# Patient Record
Sex: Female | Born: 1970
Health system: Southern US, Community
[De-identification: ages and names within clinical notes are randomized; demographics above are authoritative.]

## PROBLEM LIST (undated history)

## (undated) DIAGNOSIS — K219 Gastro-esophageal reflux disease without esophagitis: Secondary | ICD-10-CM

## (undated) HISTORY — PX: TUBAL LIGATION: SHX77

## (undated) HISTORY — DX: Gastro-esophageal reflux disease without esophagitis: K21.9

---

## 2016-02-05 ENCOUNTER — Encounter (HOSPITAL_COMMUNITY): Payer: Self-pay | Admitting: Family Medicine

## 2016-02-05 ENCOUNTER — Ambulatory Visit (HOSPITAL_COMMUNITY)
Admission: EM | Admit: 2016-02-05 | Discharge: 2016-02-05 | Disposition: A | Discharge status: Home / Self Care | Payer: Self-pay | Attending: Family Medicine | Admitting: Family Medicine

## 2016-02-05 DIAGNOSIS — R110 Nausea: Secondary | ICD-10-CM

## 2016-02-05 DIAGNOSIS — R1011 Right upper quadrant pain: Secondary | ICD-10-CM

## 2016-02-05 MED ORDER — ONDANSETRON 8 MG PO TBDP: 8.0000 mg | ORAL_TABLET | Freq: Three times a day (TID) | ORAL | 0 refills | Status: DC | PRN

## 2016-02-05 MED ORDER — HYDROCODONE-ACETAMINOPHEN 5-325 MG PO TABS: 2.0000 | ORAL_TABLET | ORAL | 0 refills | Status: DC | PRN

## 2016-02-05 NOTE — Discharge Instructions (Signed)
If developing severe abdominal pain and pain meds not helping or having fever go to ED. Need to get PCP and get worked for gallbladder disease/ gallstones.

## 2016-02-05 NOTE — ED Provider Notes (Signed)
CSN: [id number]     Arrival date & time 02/05/16  1214 History   None    Chief Complaint  Patient presents with  . Abdominal Pain   (Consider location/radiation/quality/duration/timing/severity/associated sxs/prior Treatment) Patient states she was diagnosed with gallstones in [place].  She states she is getting worsening RUQ abdominal pain and nausea on and off.  Yesterday the pain was severe.  She is nauseated in the AM.  She has minimal pain today.   The history is provided by the patient.  Abdominal Pain  Pain location:  RUQ Pain quality: aching   Pain radiates to:  RUQ Pain severity:  Moderate Onset quality:  Sudden Duration:  2 days Timing:  Intermittent Progression:  Waxing and waning Relieved by:  Nothing Worsened by:  Eating Associated symptoms: nausea     History reviewed. No pertinent past medical history. History reviewed. No pertinent surgical history. History reviewed. No pertinent family history. Social History  Substance Use Topics  . Smoking status: Never Smoker  . Smokeless tobacco: Never Used  . Alcohol use Not on file   OB History    No data available     Review of Systems  Constitutional: Negative.   HENT: Negative.   Eyes: Negative.   Respiratory: Negative.   Cardiovascular: Negative.   Gastrointestinal: Positive for abdominal pain and nausea.  Endocrine: Negative.   Genitourinary: Negative.   Musculoskeletal: Negative.   Skin: Negative.   Allergic/Immunologic: Negative.   Neurological: Negative.   Hematological: Negative.   Psychiatric/Behavioral: Negative.     Allergies  Review of patient's allergies indicates no known allergies.  Home Medications   Prior to Admission medications   Medication Sig Start Date End Date Taking? Authorizing Provider  HYDROcodone-acetaminophen (NORCO/VICODIN) 5-325 MG tablet Take 2 tablets by mouth every 4 (four) hours as needed. 02/05/16   Lysbeth Penner, FNP  ondansetron (ZOFRAN ODT) 8 MG  disintegrating tablet Take 1 tablet (8 mg total) by mouth every 8 (eight) hours as needed for nausea or vomiting. 02/05/16   Lysbeth Penner, FNP   Meds Ordered and Administered this Visit  Medications - No data to display  BP 136/74   Pulse (!) 58   Temp 98.4 F (36.9 C)   Resp 18   LMP 01/29/2016   SpO2 100%  No data found.   Physical Exam  Constitutional: She is oriented to person, place, and time. She appears well-developed and well-nourished.  HENT:  Head: Normocephalic and atraumatic.  Eyes: Conjunctivae and EOM are normal. Pupils are equal, round, and reactive to light.  Neck: Normal range of motion. Neck supple.  Cardiovascular: Normal rate, regular rhythm and normal heart sounds.   Pulmonary/Chest: Effort normal and breath sounds normal.  Abdominal: Soft. Bowel sounds are normal. There is tenderness.  RUQ abdominal tenderness.  No peritoneal signs  Neurological: She is alert and oriented to person, place, and time.    Urgent Care Course   Clinical Course    Procedures (including critical care time)  Labs Review Labs Reviewed - No data to display  Imaging Review No results found.   Visual Acuity Review  Right Eye Distance:   Left Eye Distance:   Bilateral Distance:    Right Eye Near:   Left Eye Near:    Bilateral Near:         MDM   1. Right upper quadrant abdominal pain   2. Nausea    Norco 5/325 one po q 6 hours prn #12 Zofran 8mg   one po tid prn #21  Call and get PCP and get work up for gallbladder stones. If pain severe or fever go to ED.    Lysbeth Penner, FNP 02/05/16 1358

## 2016-02-05 NOTE — ED Triage Notes (Signed)
Pt here for RUQ pain that has been going on for months but worsening over the past week. sts that she has nausea ain the morning and the pain is worse after eating. sts that she has been told she had gallstones before.

## 2016-05-28 ENCOUNTER — Other Ambulatory Visit (HOSPITAL_COMMUNITY): Payer: Self-pay

## 2016-05-28 ENCOUNTER — Emergency Department (HOSPITAL_COMMUNITY)
Admission: EM | Admit: 2016-05-28 | Discharge: 2016-05-28 | Disposition: A | Discharge status: Home / Self Care | Payer: Self-pay | Attending: Emergency Medicine | Admitting: Emergency Medicine

## 2016-05-28 ENCOUNTER — Emergency Department (HOSPITAL_COMMUNITY): Payer: Self-pay

## 2016-05-28 ENCOUNTER — Encounter (HOSPITAL_COMMUNITY): Payer: Self-pay | Admitting: Emergency Medicine

## 2016-05-28 DIAGNOSIS — K8020 Calculus of gallbladder without cholecystitis without obstruction: Secondary | ICD-10-CM | POA: Insufficient documentation

## 2016-05-28 DIAGNOSIS — K8080 Other cholelithiasis without obstruction: Secondary | ICD-10-CM

## 2016-05-28 DIAGNOSIS — R1011 Right upper quadrant pain: Secondary | ICD-10-CM

## 2016-05-28 LAB — CBC
HCT: 38.8 % (ref 36.0–46.0)
Hemoglobin: 13 g/dL (ref 12.0–15.0)
MCH: 30.7 pg (ref 26.0–34.0)
MCHC: 33.5 g/dL (ref 30.0–36.0)
MCV: 91.7 fL (ref 78.0–100.0)
PLATELETS: 367 10*3/uL (ref 150–400)
RBC: 4.23 MIL/uL (ref 3.87–5.11)
RDW: 13 % (ref 11.5–15.5)
WBC: 3.8 10*3/uL — AB (ref 4.0–10.5)

## 2016-05-28 LAB — URINALYSIS, ROUTINE W REFLEX MICROSCOPIC
BILIRUBIN URINE: NEGATIVE
GLUCOSE, UA: NEGATIVE mg/dL
KETONES UR: NEGATIVE mg/dL
Nitrite: NEGATIVE
PH: 5.5 (ref 5.0–8.0)
PROTEIN: NEGATIVE mg/dL
Specific Gravity, Urine: 1.01 (ref 1.005–1.030)

## 2016-05-28 LAB — LIPASE, BLOOD: LIPASE: 39 U/L (ref 11–51)

## 2016-05-28 LAB — COMPREHENSIVE METABOLIC PANEL
ALBUMIN: 3.9 g/dL (ref 3.5–5.0)
ALT: 22 U/L (ref 14–54)
AST: 20 U/L (ref 15–41)
Alkaline Phosphatase: 29 U/L — ABNORMAL LOW (ref 38–126)
Anion gap: 6 (ref 5–15)
BUN: 9 mg/dL (ref 6–20)
CHLORIDE: 106 mmol/L (ref 101–111)
CO2: 26 mmol/L (ref 22–32)
CREATININE: 0.87 mg/dL (ref 0.44–1.00)
Calcium: 9.5 mg/dL (ref 8.9–10.3)
GFR calc Af Amer: >60 mL/min (ref >60)
GLUCOSE: 92 mg/dL (ref 65–99)
Potassium: 3.8 mmol/L (ref 3.5–5.1)
Sodium: 138 mmol/L (ref 135–145)
Total Bilirubin: 1 mg/dL (ref 0.3–1.2)
Total Protein: 7.2 g/dL (ref 6.5–8.1)

## 2016-05-28 LAB — I-STAT BETA HCG BLOOD, ED (MC, WL, AP ONLY): I-stat hCG, quantitative: <5 m[IU]/mL (ref <5)

## 2016-05-28 LAB — URINE MICROSCOPIC-ADD ON

## 2016-05-28 MED ORDER — FENTANYL CITRATE (PF) 100 MCG/2ML IJ SOLN
25.0000 ug | Freq: Once | INTRAMUSCULAR | Status: DC
  Filled 2016-05-28: qty 2

## 2016-05-28 MED ORDER — HYDROCODONE-ACETAMINOPHEN 5-325 MG PO TABS
1.0000 | ORAL_TABLET | Freq: Once | ORAL | Status: AC
  Administered 2016-05-28: 1 via ORAL
  Filled 2016-05-28: qty 1

## 2016-05-28 NOTE — ED Notes (Signed)
Pt states she wakes in the morning with nausea x 3 mornings.  Pain RUQ off and on approx one yr.  Was worked up and dx with gallstones in her country [place] prior to coming here.  Feels the pain is the same now.

## 2016-05-28 NOTE — ED Provider Notes (Signed)
[facility] DEPT Provider Note   CSN: [id number] Arrival date & time: 05/28/16  [id number]  History   Chief Complaint Chief Complaint  Patient presents with  . Abdominal Pain   HPI Haley Chandler is a 45 y.o. female.   Abdominal Pain   This is a recurrent problem. The current episode started more than 2 days ago (3 days). The problem occurs daily. The problem has not changed since onset.The pain is located in the RUQ. The pain is at a severity of 5/10. The pain is moderate. Associated symptoms include anorexia and nausea. Pertinent negatives include fever, diarrhea, flatus, vomiting, dysuria and hematuria. The symptoms are aggravated by eating.   History reviewed. No pertinent past medical history.  There are no active problems to display for this patient.  History reviewed. No pertinent surgical history.  OB History    No data available     Home Medications    Prior to Admission medications   Medication Sig Start Date End Date Taking? Authorizing Provider  HYDROcodone-acetaminophen (NORCO/VICODIN) 5-325 MG tablet Take 2 tablets by mouth every 4 (four) hours as needed. 02/05/16   Lysbeth Penner, FNP  ondansetron (ZOFRAN ODT) 8 MG disintegrating tablet Take 1 tablet (8 mg total) by mouth every 8 (eight) hours as needed for nausea or vomiting. 02/05/16   Lysbeth Penner, FNP    Family History History reviewed. No pertinent family history.  Social History Social History  Substance Use Topics  . Smoking status: Never Smoker  . Smokeless tobacco: Never Used  . Alcohol use Yes     Comment: occ     Allergies   Patient has no known allergies.   Review of Systems Review of Systems  Constitutional: Negative for fever.  Respiratory: Negative for chest tightness and shortness of breath.   Gastrointestinal: Positive for abdominal pain, anorexia and nausea. Negative for diarrhea, flatus and vomiting.  Genitourinary: Negative for dysuria and hematuria.  All other systems  reviewed and are negative.  Physical Exam Updated Vital Signs BP 129/79 (BP Location: Right Arm)   Pulse 72   Temp 98.2 F (36.8 C) (Oral)   Resp 22   SpO2 100%   Physical Exam  Constitutional: She is oriented to person, place, and time. She appears well-developed and well-nourished. No distress.  HENT:  Head: Normocephalic and atraumatic.  Eyes: Pupils are equal, round, and reactive to light.  Neck: Normal range of motion.  Cardiovascular: Normal rate and regular rhythm.   Pulmonary/Chest: Effort normal and breath sounds normal. No respiratory distress. She has no wheezes. She has no rales.  Abdominal: Soft. She exhibits no distension and no mass. There is tenderness. There is no rebound and no guarding.  RUQ tenderness with positive Murphy's sign  Musculoskeletal: Normal range of motion.  Neurological: She is alert and oriented to person, place, and time. No cranial nerve deficit. Coordination normal.  Skin: Skin is warm. Capillary refill takes less than 2 seconds. No rash noted. She is not diaphoretic.  Psychiatric: She has a normal mood and affect. Her behavior is normal. Thought content normal.  Nursing note and vitals reviewed.  ED Treatments / Results  Labs (all labs ordered are listed, but only abnormal results are displayed) Labs Reviewed  COMPREHENSIVE METABOLIC PANEL - Abnormal; Notable for the following:       Result Value   Alkaline Phosphatase 29 (*)    All other components within normal limits  CBC - Abnormal; Notable for the following:  WBC 3.8 (*)    All other components within normal limits  URINALYSIS, ROUTINE W REFLEX MICROSCOPIC (NOT AT [facility]) - Abnormal; Notable for the following:    Hgb urine dipstick SMALL (*)    Leukocytes, UA TRACE (*)    All other components within normal limits  URINE MICROSCOPIC-ADD ON - Abnormal; Notable for the following:    Squamous Epithelial / LPF 0-5 (*)    Bacteria, UA RARE (*)    All other components within normal  limits  LIPASE, BLOOD  I-STAT BETA HCG BLOOD, ED ([facility], [facility], [facility] ONLY)   EKG  EKG Interpretation None      Radiology US Abdomen Limited Ruq  Result Date: 05/28/2016 CLINICAL DATA:  Right upper quadrant pain for the past 3 days. EXAM: US ABDOMEN LIMITED - RIGHT UPPER QUADRANT COMPARISON:  None. FINDINGS: Gallbladder: The gallbladder is adequately distended. There are multiple echogenic mobile shadowing stones. Sludge is present as well. There is no gallbladder wall thickening, pericholecystic fluid, or positive sonographic Murphy's sign. Common bile duct: Diameter: 2.5 mm Liver: The hepatic echotexture is normal. There is no focal mass or ductal dilation. IMPRESSION: Cholelithiasis without evidence of acute cholecystitis. Normal appearance of the liver and common bile duct. Electronically Signed   By: David  Martinique M.D.   On: 05/28/2016 13:32    Procedures Procedures (including critical care time)  Medications Ordered in ED Medications - No data to display   Initial Impression / Assessment and Plan / ED Course  I have reviewed the triage vital signs and the nursing notes.  Pertinent labs & imaging results that were available during my care of the patient were reviewed by me and considered in my medical decision making (see chart for details).  Clinical Course    Patient is a pleasant 45 year old female with past medical history of cholelithiasis which was diagnosed in [place] who presents to emergency department today with recurrent right upper quadrant abdominal pain for the past 3 days. Patient's associated symptoms include nausea but denies any vomiting, additional GI or GU symptoms.  Patient well-appearing with right upper quadrant tenderness. More likely symptomatically lithiasis. We'll obtain labs to rule out pancreatitis, and UA for possible UA.   RUQ [place] ordered.   Right upper quadrant ultrasound shows no cholecystitis but does show cholelithiasis. Patient has complete  resolution of her pain. The nature of the pain, her abdominal exam, believe this to be symptomatic cholelithiasis.  Patient has no evidence of pancreatitis. No evidence of UTI.  Patient discharged home with general surgery consult and in agreement with plan.  Final Clinical Impressions(s) / ED Diagnoses   Final diagnoses:  RUQ pain  Right upper quadrant abdominal pain  Biliary calculus of other site without obstruction     Roberto Scales, MD 05/28/16 [place], DO 05/28/16 1426

## 2016-05-28 NOTE — ED Triage Notes (Signed)
Pt sts right sided abd pain that feels similar to gall bladder issues in the past; pt sts pain x 3 days

## 2016-05-28 NOTE — ED Notes (Signed)
ED Provider at bedside. 

## 2016-05-28 NOTE — ED Notes (Signed)
Pt aware that she will be going to [place] in approx 50 minutes.  States she is more comfortable now,.

## 2016-08-31 ENCOUNTER — Emergency Department (HOSPITAL_COMMUNITY)
Admission: EM | Admit: 2016-08-31 | Discharge: 2016-09-01 | Disposition: A | Discharge status: Home / Self Care | Payer: Self-pay | Attending: Emergency Medicine | Admitting: Emergency Medicine

## 2016-08-31 ENCOUNTER — Encounter (HOSPITAL_COMMUNITY): Payer: Self-pay | Admitting: Emergency Medicine

## 2016-08-31 DIAGNOSIS — C801 Malignant (primary) neoplasm, unspecified: Secondary | ICD-10-CM | POA: Insufficient documentation

## 2016-08-31 DIAGNOSIS — R0689 Other abnormalities of breathing: Secondary | ICD-10-CM | POA: Insufficient documentation

## 2016-08-31 DIAGNOSIS — K219 Gastro-esophageal reflux disease without esophagitis: Secondary | ICD-10-CM | POA: Insufficient documentation

## 2016-08-31 LAB — CBC
HCT: 37.1 % (ref 36.0–46.0)
Hemoglobin: 12.7 g/dL (ref 12.0–15.0)
MCH: 30.8 pg (ref 26.0–34.0)
MCHC: 34.2 g/dL (ref 30.0–36.0)
MCV: 89.8 fL (ref 78.0–100.0)
PLATELETS: 360 10*3/uL (ref 150–400)
RBC: 4.13 MIL/uL (ref 3.87–5.11)
RDW: 12.4 % (ref 11.5–15.5)
WBC: 5.9 10*3/uL (ref 4.0–10.5)

## 2016-08-31 LAB — LIPASE, BLOOD: Lipase: 40 U/L (ref 11–51)

## 2016-08-31 LAB — URINALYSIS, ROUTINE W REFLEX MICROSCOPIC
Bacteria, UA: NONE SEEN
Bilirubin Urine: NEGATIVE
GLUCOSE, UA: NEGATIVE mg/dL
Ketones, ur: NEGATIVE mg/dL
Nitrite: NEGATIVE
PH: 5 (ref 5.0–8.0)
PROTEIN: NEGATIVE mg/dL
SPECIFIC GRAVITY, URINE: 1.004 — AB (ref 1.005–1.030)

## 2016-08-31 LAB — COMPREHENSIVE METABOLIC PANEL
ALT: 22 U/L (ref 14–54)
AST: 25 U/L (ref 15–41)
Albumin: 4.4 g/dL (ref 3.5–5.0)
Alkaline Phosphatase: 29 U/L — ABNORMAL LOW (ref 38–126)
Anion gap: 12 (ref 5–15)
BUN: 7 mg/dL (ref 6–20)
CHLORIDE: 104 mmol/L (ref 101–111)
CO2: 17 mmol/L — AB (ref 22–32)
CREATININE: 0.94 mg/dL (ref 0.44–1.00)
Calcium: 9.3 mg/dL (ref 8.9–10.3)
GFR calc non Af Amer: >60 mL/min (ref >60)
GLUCOSE: 115 mg/dL — AB (ref 65–99)
Potassium: 3.3 mmol/L — ABNORMAL LOW (ref 3.5–5.1)
SODIUM: 133 mmol/L — AB (ref 135–145)
Total Bilirubin: 1.1 mg/dL (ref 0.3–1.2)
Total Protein: 7.7 g/dL (ref 6.5–8.1)

## 2016-08-31 NOTE — ED Triage Notes (Signed)
Pt presents to ER for pain/discomfort in her throat that began today; pt states she feels as if something is stuck in her eosphagus; pt not having problems speaking, denies sob; pt stating "nothing going down, nothing coming up"

## 2016-09-01 ENCOUNTER — Emergency Department (HOSPITAL_COMMUNITY): Payer: Self-pay

## 2016-09-01 LAB — I-STAT VENOUS BLOOD GAS, ED
ACID-BASE DEFICIT: 5 mmol/L — AB (ref 0.0–2.0)
Bicarbonate: 19.7 mmol/L — ABNORMAL LOW (ref 20.0–28.0)
O2 SAT: 79 %
PO2 VEN: 45 mmHg (ref 32.0–45.0)
TCO2: 21 mmol/L (ref 0–100)
pCO2, Ven: 35.1 mmHg — ABNORMAL LOW (ref 44.0–60.0)
pH, Ven: 7.356 (ref 7.250–7.430)

## 2016-09-01 LAB — BRAIN NATRIURETIC PEPTIDE: B Natriuretic Peptide: 37.4 pg/mL (ref 0.0–100.0)

## 2016-09-01 MED ORDER — OMEPRAZOLE 20 MG PO CPDR: 20.0000 mg | DELAYED_RELEASE_CAPSULE | Freq: Every day | ORAL | 0 refills | Status: DC

## 2016-09-01 MED ORDER — GI COCKTAIL ~~LOC~~
30.0000 mL | Freq: Once | ORAL | Status: AC
  Administered 2016-09-01: 30 mL via ORAL
  Filled 2016-09-01: qty 30

## 2016-09-01 NOTE — ED Provider Notes (Signed)
[facility] DEPT Provider Note   CSN: [id number] Arrival date & time: 08/31/16  2032     History   Chief Complaint Chief Complaint  Patient presents with  . Gastroesophageal Reflux  . Abdominal Pain  . Swallowed Foreign Body    possible; "nothing going down, nothing coming up"    HPI Haley Chandler is a 46 y.o. female.  HPI PT comes in with cc of DIB and CP/thrtoat discomfort. PT reports that for the 3-4 days she has been having pain in her chest and throat area when she eats. She has a feeling like something is stuck - but she has been able to swallow food and water w/o any problems. Pt also reports that this evening she had dib while laying down and needed cold air to feel better. Pt has no hx of PE, DVT and denies any exogenous hormone (testosterone / estrogen) use, long distance travels or surgery in the past 6 weeks, active cancer, recent immobilization. Pt has no numbness, tingling. Pt has no cardiac dz. She is taking OTC meds only. She has no allergies.  History reviewed. No pertinent past medical history.  There are no active problems to display for this patient.   History reviewed. No pertinent surgical history.  OB History    No data available       Home Medications    Prior to Admission medications   Medication Sig Start Date End Date Taking? Authorizing Provider  Ibuprofen-Diphenhydramine Cit (ADVIL PM PO) Take 2 tablets by mouth daily as needed (for pain or headache).    Yes Historical Provider, MD    Family History History reviewed. No pertinent family history.  Social History Social History  Substance Use Topics  . Smoking status: Never Smoker  . Smokeless tobacco: Never Used  . Alcohol use Yes     Comment: occ     Allergies   Patient has no known allergies.   Review of Systems Review of Systems  ROS 10 Systems reviewed and are negative for acute change except as noted in the HPI.     Physical Exam Updated Vital Signs BP 142/86  (BP Location: Left Arm)   Pulse 84   Temp 98.2 F (36.8 C) (Oral)   Resp 18   Ht 6\' 2"  (1.88 m)   Wt 220 lb (99.8 kg)   LMP 08/10/2016   SpO2 100%   BMI 28.25 kg/m   Physical Exam  Constitutional: She is oriented to person, place, and time. She appears well-developed and well-nourished.  HENT:  Head: Normocephalic and atraumatic.  Eyes: EOM are normal. Pupils are equal, round, and reactive to light.  Neck: Neck supple. No thyromegaly present.  Cardiovascular: Normal rate and regular rhythm.   Murmur heard. Pulmonary/Chest: Effort normal. No respiratory distress.  Abdominal: Soft. She exhibits no distension. There is no tenderness. There is no rebound and no guarding.  Lymphadenopathy:    She has no cervical adenopathy.  Neurological: She is alert and oriented to person, place, and time.  Skin: Skin is warm and dry.  Nursing note and vitals reviewed.    ED Treatments / Results  Labs (all labs ordered are listed, but only abnormal results are displayed) Labs Reviewed  COMPREHENSIVE METABOLIC PANEL - Abnormal; Notable for the following:       Result Value   Sodium 133 (*)    Potassium 3.3 (*)    CO2 17 (*)    Glucose, Bld 115 (*)    Alkaline Phosphatase 29 (*)  All other components within normal limits  URINALYSIS, ROUTINE W REFLEX MICROSCOPIC - Abnormal; Notable for the following:    Color, Urine STRAW (*)    APPearance HAZY (*)    Specific Gravity, Urine 1.004 (*)    Hgb urine dipstick MODERATE (*)    Leukocytes, UA MODERATE (*)    Squamous Epithelial / LPF 6-30 (*)    All other components within normal limits  LIPASE, BLOOD  CBC  BRAIN NATRIURETIC PEPTIDE  I-STAT VENOUS BLOOD GAS, ED    EKG  EKG Interpretation None       Radiology Dg Chest 2 View  Result Date: 09/01/2016 CLINICAL DATA:  Throw pain and difficulty swallowing EXAM: CHEST  2 VIEW COMPARISON:  None. FINDINGS: The lungs are well inflated. Cardiomediastinal contours are normal. No  pneumothorax or pleural effusion. No focal airspace consolidation or pulmonary edema. No radiopaque foreign body overlying the esophagus. IMPRESSION: Clear lungs.  No radiopaque foreign body overlying the esophagus. Electronically Signed   By: Ulyses Jarred M.D.   On: 09/01/2016 00:47    Procedures Procedures (including critical care time)  Medications Ordered in ED Medications  gi cocktail (Maalox,Lidocaine,Donnatal) (not administered)     Initial Impression / Assessment and Plan / ED Course  I have reviewed the triage vital signs and the nursing notes.  Pertinent labs & imaging results that were available during my care of the patient were reviewed by me and considered in my medical decision making (see chart for details).    Pt comes in with cc of DIB and some pain with po intake over the chest and throat. We will treat as GERD. WE will get CXR and ensure there is no CHF. PT has no pcp so workup more extensive than usual - if normal, will give [facility] f/u.  Final Clinical Impressions(s) / ED Diagnoses   Final diagnoses:  Gastroesophageal reflux disease, esophagitis presence not specified    New Prescriptions New Prescriptions   No medications on file     Varney Biles, MD 09/01/16 0111

## 2016-09-14 ENCOUNTER — Ambulatory Visit (HOSPITAL_COMMUNITY)
Admission: EM | Admit: 2016-09-14 | Discharge: 2016-09-14 | Disposition: A | Discharge status: Home / Self Care | Payer: Self-pay | Attending: Internal Medicine | Admitting: Internal Medicine

## 2016-09-14 ENCOUNTER — Encounter (HOSPITAL_COMMUNITY): Payer: Self-pay | Admitting: Family Medicine

## 2016-09-14 DIAGNOSIS — K2100 Gastro-esophageal reflux disease with esophagitis, without bleeding: Secondary | ICD-10-CM

## 2016-09-14 MED ORDER — GI COCKTAIL ~~LOC~~
ORAL | Status: AC
  Filled 2016-09-14: qty 30

## 2016-09-14 MED ORDER — SUCRALFATE 1 GM/10ML PO SUSP: 1.0000 g | Freq: Three times a day (TID) | ORAL | 1 refills | Status: DC

## 2016-09-14 MED ORDER — OMEPRAZOLE 40 MG PO CPDR: 40.0000 mg | DELAYED_RELEASE_CAPSULE | Freq: Two times a day (BID) | ORAL | 1 refills | Status: DC

## 2016-09-14 MED ORDER — GI COCKTAIL ~~LOC~~
30.0000 mL | Freq: Once | ORAL | Status: AC
  Administered 2016-09-14: 30 mL via ORAL

## 2016-09-14 NOTE — ED Triage Notes (Signed)
Pt here for throat pain and the sense of choking. recently dx with acid reflux and taking omeprazole without relief. sts she feels like the food is getting stuck in throat.

## 2016-09-14 NOTE — Discharge Instructions (Addendum)
A dose of 'gi cocktail' was given at the urgent care today, as this was helpful in the past.  Prescriptions for sucralfate suspension to soothe throat and increased dose of omeprazole were sent to the [facility].  Establishing care with a primary care provider, like [place] will be less expensive in the long run than repeated visits to the emergency room.  Acid reflux is a tough problem and sometimes multiple treatment trials are needed to get relief.

## 2016-09-14 NOTE — ED Provider Notes (Signed)
[facility]    CSN: [id number] Arrival date & time: 09/14/16  1010     History   Chief Complaint Chief Complaint  Patient presents with  . Sore Throat  . Choking    HPI Haley Chandler is a 46 y.o. female. She presents today with recent history of presentation to the emergency room 2 weeks ago with throat discomfort, sensation of choking. She had lab work and a chest x-ray, and was diagnosed with acid reflux. She's been taking omeprazole over-the-counter once daily, and also famotidine daily at bedtime and Tums as needed, without relief. She had a GI cocktail in the emergency room, which she felt provided a lot of symptomatic relief. She states that she has not obtained a primary care provider because she has no insurance. She could not afford the deposit for visit to a specialist previously.  She is able to drink liquids, but is afraid to eat solid food because it stirs up the choking sensation, and she feels like she will throw up. No fever. Decreased frequency of bowel movements.  HPI  History reviewed. No pertinent past medical history.   History reviewed. No pertinent surgical history.    Home Medications    Prior to Admission medications   Medication Sig Start Date End Date Taking? Authorizing Provider  Ibuprofen-Diphenhydramine Cit (ADVIL PM PO) Take 2 tablets by mouth daily as needed (for pain or headache).     Historical Provider, MD  omeprazole (PRILOSEC) 40 MG capsule Take 1 capsule (40 mg total) by mouth 2 (two) times daily before a meal. 09/14/16 09/28/16  Sherlene Shams, MD  sucralfate (CARAFATE) 1 GM/10ML suspension Take 10 mLs (1 g total) by mouth 4 (four) times daily -  with meals and at bedtime. 09/14/16   Sherlene Shams, MD    Family History History reviewed. No pertinent family history.  Social History Social History  Substance Use Topics  . Smoking status: Never Smoker  . Smokeless tobacco: Never Used  . Alcohol use Yes     Comment: occ      Allergies   Patient has no known allergies.   Review of Systems Review of Systems  All other systems reviewed and are negative.    Physical Exam Triage Vital Signs ED Triage Vitals [09/14/16 1035]  Enc Vitals Group     BP 131/83     Pulse Rate 60     Resp 12     Temp 98.5 F (36.9 C)     Temp Source Oral     SpO2 100 %     Weight      Height      Pain Score      Pain Loc    Updated Vital Signs BP 131/83 (BP Location: Right Arm)   Pulse 60   Temp 98.5 F (36.9 C) (Oral)   Resp 12   SpO2 100%   Physical Exam  Constitutional: She is oriented to person, place, and time. No distress.  Alert, nicely groomed  HENT:  Head: Atraumatic.  Eyes:  Conjugate gaze, no eye redness/drainage  Neck: Neck supple.  Cardiovascular: Normal rate and regular rhythm.   Pulmonary/Chest: No respiratory distress. She has no wheezes. She has no rales.  Lungs clear, symmetric breath sounds  Abdominal: Soft. She exhibits no distension. There is no rebound and no guarding.  Moderate tenderness to deep palpation in the epigastrium and left upper quadrant, patient states that this is new. Moderate tenderness to deep palpation  in the right upper quadrant, which the patient states is chronic.  Musculoskeletal: Normal range of motion.  No leg swelling  Neurological: She is alert and oriented to person, place, and time.  Skin: Skin is warm and dry.  No cyanosis  Nursing note and vitals reviewed.    UC Treatments / Results   Procedures Procedures (including critical care time)  Medications Ordered in UC Medications  gi cocktail (Maalox,Lidocaine,Donnatal) (30 mLs Oral Given 09/14/16 1107)    Final Clinical Impressions(s) / UC Diagnoses   Final diagnoses:  Gastroesophageal reflux disease with esophagitis   A dose of 'gi cocktail' was given at the urgent care today, as this was helpful in the past.  Prescriptions for sucralfate suspension to soothe throat and increased dose of  omeprazole were sent to the [facility] at [facility].  Establishing care with a primary care provider, like [place] will be less expensive in the long run than repeated visits to the emergency room.  Acid reflux is a tough problem and sometimes multiple treatment trials are needed to get relief.    Meds ordered this encounter  Medications  . omeprazole (PRILOSEC) 40 MG capsule    Sig: Take 1 capsule (40 mg total) by mouth 2 (two) times daily before a meal.    Dispense:  28 capsule    Refill:  1  . sucralfate (CARAFATE) 1 GM/10ML suspension    Sig: Take 10 mLs (1 g total) by mouth 4 (four) times daily -  with meals and at bedtime.    Dispense:  420 mL    Refill:  1       Sherlene Shams, MD 09/16/16 2147

## 2016-09-14 NOTE — ED Notes (Signed)
Bed: UC07 Expected date:  Expected time:  Means of arrival:  Comments: 

## 2016-10-02 ENCOUNTER — Encounter (INDEPENDENT_AMBULATORY_CARE_PROVIDER_SITE_OTHER): Payer: Self-pay | Admitting: Physician Assistant

## 2016-10-02 ENCOUNTER — Ambulatory Visit (INDEPENDENT_AMBULATORY_CARE_PROVIDER_SITE_OTHER): Payer: Self-pay | Admitting: Physician Assistant

## 2016-10-02 VITALS — BP 125/77 | HR 59 | Temp 97.9°F | Ht 73.0 in | Wt 218.4 lb

## 2016-10-02 DIAGNOSIS — M545 Low back pain, unspecified: Secondary | ICD-10-CM

## 2016-10-02 DIAGNOSIS — R202 Paresthesia of skin: Secondary | ICD-10-CM

## 2016-10-02 DIAGNOSIS — H9313 Tinnitus, bilateral: Secondary | ICD-10-CM

## 2016-10-02 DIAGNOSIS — H578 Other specified disorders of eye and adnexa: Secondary | ICD-10-CM

## 2016-10-02 DIAGNOSIS — R1013 Epigastric pain: Secondary | ICD-10-CM

## 2016-10-02 DIAGNOSIS — K5900 Constipation, unspecified: Secondary | ICD-10-CM

## 2016-10-02 DIAGNOSIS — R3589 Other polyuria: Secondary | ICD-10-CM

## 2016-10-02 DIAGNOSIS — R631 Polydipsia: Secondary | ICD-10-CM

## 2016-10-02 DIAGNOSIS — K219 Gastro-esophageal reflux disease without esophagitis: Secondary | ICD-10-CM

## 2016-10-02 DIAGNOSIS — H5789 Other specified disorders of eye and adnexa: Secondary | ICD-10-CM

## 2016-10-02 DIAGNOSIS — R358 Other polyuria: Secondary | ICD-10-CM

## 2016-10-02 DIAGNOSIS — G8929 Other chronic pain: Secondary | ICD-10-CM

## 2016-10-02 LAB — POCT URINALYSIS DIPSTICK
BILIRUBIN UA: NEGATIVE
Glucose, UA: NEGATIVE
Nitrite, UA: NEGATIVE
PH UA: 5.5 (ref 5.0–8.0)
Protein, UA: NEGATIVE
Spec Grav, UA: 1.02 (ref 1.030–1.035)
Urobilinogen, UA: 0.2 (ref ?–2.0)

## 2016-10-02 LAB — POCT GLYCOSYLATED HEMOGLOBIN (HGB A1C): Hemoglobin A1C: 5.4

## 2016-10-02 MED ORDER — OMEPRAZOLE 40 MG PO CPDR: 40.0000 mg | DELAYED_RELEASE_CAPSULE | Freq: Every day | ORAL | 1 refills | Status: DC

## 2016-10-02 MED ORDER — KETOTIFEN FUMARATE 0.025 % OP SOLN: 1.0000 [drp] | Freq: Two times a day (BID) | OPHTHALMIC | 0 refills | Status: DC

## 2016-10-02 MED ORDER — SENNOSIDES-DOCUSATE SODIUM 8.6-50 MG PO TABS: 1.0000 | ORAL_TABLET | Freq: Every day | ORAL | 0 refills | Status: DC | PRN

## 2016-10-02 MED ORDER — GABAPENTIN 100 MG PO CAPS: 100.0000 mg | ORAL_CAPSULE | Freq: Three times a day (TID) | ORAL | 1 refills | Status: DC

## 2016-10-02 MED FILL — OMEPRAZOLE DR 40 MG CAPSULE: 40 | 30 days supply | Qty: 30 | Fill #0

## 2016-10-02 MED FILL — ?GABAPENTIN 100 MG CAP: 30 days supply | Qty: 90 | Fill #0

## 2016-10-02 NOTE — Patient Instructions (Addendum)
Tinnitus Tinnitus refers to hearing a sound when there is no actual source for that sound. This is often described as ringing in the ears. However, people with this condition may hear a variety of noises. A person may hear the sound in one ear or in both ears. The sounds of tinnitus can be soft, loud, or somewhere in between. Tinnitus can last for a few seconds or can be constant for days. It may go away without treatment and come back at various times. When tinnitus is constant or happens often, it can lead to other problems, such as trouble sleeping and trouble concentrating. Almost everyone experiences tinnitus at some point. Tinnitus that is long-lasting (chronic) or comes back often is a problem that may require medical attention. What are the causes? The cause of tinnitus is often not known. In some cases, it can result from other problems or conditions, including:  Exposure to loud noises from machinery, music, or other sources.  Hearing loss.  Ear or sinus infections.  Earwax buildup.  A foreign object in the ear.  Use of certain medicines.  Use of alcohol and caffeine.  High blood pressure.  Heart diseases.  Anemia.  Allergies.  Meniere disease.  Thyroid problems.  Tumors.  An enlarged part of a weakened blood vessel (aneurysm). What are the signs or symptoms? The main symptom of tinnitus is hearing a sound when there is no source for that sound. It may sound like:  Buzzing.  Roaring.  Ringing.  Blowing air, similar to the sound heard when you listen to a seashell.  Hissing.  Whistling.  Sizzling.  Humming.  Running water.  A sustained musical note. How is this diagnosed? Tinnitus is diagnosed based on your symptoms. Your health care provider will do a physical exam. A comprehensive hearing exam (audiologic exam) will be done if your tinnitus:  Affects only one ear (unilateral).  Causes hearing difficulties.  Lasts 6 months or longer. You  may also need to see a health care provider who specializes in hearing disorders (audiologist). You may be asked to complete a questionnaire to determine the severity of your tinnitus. Tests may be done to help determine the cause and to rule out other conditions. These can include:  Imaging studies of your head and brain, such as:  A CT scan.  An MRI.  An imaging study of your blood vessels (angiogram). How is this treated? Treating an underlying medical condition can sometimes make tinnitus go away. If your tinnitus continues, other treatments may include:  Medicines, such as certain antidepressants or sleeping aids.  Sound generators to mask the tinnitus. These include:  Tabletop sound machines that play relaxing sounds to help you fall asleep.  Wearable devices that fit in your ear and play sounds or music.  A small device that uses headphones to deliver a signal embedded in music (acoustic neural stimulation). In time, this may change the pathways of your brain and make you less sensitive to tinnitus. This device is used for very severe cases when no other treatment is working.  Therapy and counseling to help you manage the stress of living with tinnitus.  Using hearing aids or cochlear implants, if your tinnitus is related to hearing loss. Follow these instructions at home:  When possible, avoid being in loud places and being exposed to loud sounds.  Wear hearing protection, such as earplugs, when you are exposed to loud noises.  Do not take stimulants, such as nicotine, alcohol, or caffeine.  Practice techniques for reducing stress, such as meditation, yoga, or deep breathing.  Use a white noise machine, a humidifier, or other devices to mask the sound of tinnitus.  Sleep with your head slightly raised. This may reduce the impact of tinnitus.  Try to get plenty of rest each night. Contact a health care provider if:  You have tinnitus in just one ear.  Your tinnitus  continues for 3 weeks or longer without stopping.  Home care measures are not helping.  You have tinnitus after a head injury.  You have tinnitus along with any of the following:  Dizziness.  Loss of balance.  Nausea and vomiting. This information is not intended to replace advice given to you by your health care provider. Make sure you discuss any questions you have with your health care provider. Document Released: 06/29/2005 Document Revised: 03/01/2016 Document Reviewed: 11/29/2013 Elsevier Interactive Patient Education  2017 [place] Diet A bland diet consists of foods that do not have a lot of fat or fiber. Foods without fat or fiber are easier for the body to digest. They are also less likely to irritate your mouth, throat, stomach, and other parts of your gastrointestinal tract. A bland diet is sometimes called a BRAT diet. What is my plan? Your health care provider or dietitian may recommend specific changes to your diet to prevent and treat your symptoms, such as:  Eating small meals often.  Cooking food until it is soft enough to chew easily.  Chewing your food well.  Drinking fluids slowly.  Not eating foods that are very spicy, sour, or fatty.  Not eating citrus fruits, such as oranges and grapefruit. What do I need to know about this diet?  Eat a variety of foods from the bland diet food list.  Do not follow a bland diet longer than you have to.  Ask your health care provider whether you should take vitamins. What foods can I eat? Grains   Hot cereals, such as cream of wheat. Bread, crackers, or tortillas made from refined white flour. Rice. Vegetables  Canned or cooked vegetables. Mashed or boiled potatoes. Fruits  Bananas. Applesauce. Other types of cooked or canned fruit with the skin and seeds removed, such as canned peaches or pears. Meats and Other Protein Sources  Scrambled eggs. Creamy peanut butter or other nut butters. Lean,  well-cooked meats, such as chicken or fish. Tofu. Soups or broths. Dairy  Low-fat dairy products, such as milk, cottage cheese, or yogurt. Beverages  Water. Herbal tea. Apple juice. Sweets and Desserts  Pudding. Custard. Fruit gelatin. Ice cream. Fats and Oils  Mild salad dressings. Canola or olive oil. The items listed above may not be a complete list of allowed foods or beverages. Contact your dietitian for more options.  What foods are not recommended? Foods and ingredients that are often not recommended include:  Spicy foods, such as hot sauce or salsa.  Fried foods.  Sour foods, such as pickled or fermented foods.  Raw vegetables or fruits, especially citrus or berries.  Caffeinated drinks.  Alcohol.  Strongly flavored seasonings or condiments. The items listed above may not be a complete list of foods and beverages that are not allowed. Contact your dietitian for more information.  This information is not intended to replace advice given to you by your health care provider. Make sure you discuss any questions you have with your health care provider. Document Released: 10/21/2015 Document Revised: 12/05/2015 Document Reviewed: 07/11/2014 Elsevier Interactive Patient  Education  2017 [employer]vier Inc.  

## 2016-10-02 NOTE — Progress Notes (Signed)
Subjective:  Patient ID: Haley Chandler, female    DOB: 1970/11/18  Age: 46 y.o. MRN: [id number]  CC: multiple complaints  HPI Haley Chandler is a 46 y.o. female with no significant PMH presents for GERD, mild epigastric pain, burning of feet, constipation, tinnitus b/l, eye irritation b/l, chronic left LBP, polyuria, and polydipsia. Says she is worried about her blood sugar because her mother is a diabetic. Patient states she drinks a lot of water and urinates frequently but feels she is at baseline. Has not ever needed take any anti-gylcemics. In regards to GERD, she has been taking omeprazole with reported relief of reflux but no relief of epigastric pain. Mild to moderate pain felt at the epigastrium. No hematochezia, melena, or hematemesis/hemoptysis. Has constipation with last bowel movement two days ago. Also complains of high frequency ringing in the ears most prevalent when in silence and resolved with conversation or external noises are present. Eyes have also been irritated lately. Has not used anything for relief. Lastly, reports a chronic hx of left sided LBP without radiculopathy. Onset before coming to [place]. Unknown etiology. Feels a dull ache at the lumbar region but none currently.  No other symptoms/complaints to report.    ROS Review of Systems  Constitutional: Negative for chills, fever and malaise/fatigue.  Eyes: Negative for blurred vision.  Respiratory: Negative for shortness of breath.   Cardiovascular: Negative for chest pain and palpitations.  Gastrointestinal: Positive for constipation and heartburn. Negative for abdominal pain, blood in stool, diarrhea, melena, nausea and vomiting.  Genitourinary: Positive for frequency. Negative for dysuria, hematuria and urgency.  Musculoskeletal: Positive for back pain. Negative for joint pain and myalgias.  Skin: Negative for rash.  Neurological: Negative for tingling and headaches.       Burning of the feet  Endo/Heme/Allergies:  Positive for polydipsia.  Psychiatric/Behavioral: Negative for depression. The patient is not nervous/anxious.     Objective:  BP 125/77 (BP Location: Left Arm, Patient Position: Sitting, Cuff Size: Normal)   Pulse (!) 59   Temp 97.9 F (36.6 C) (Oral)   Ht 6\' 1"  (1.854 m)   Wt 218 lb 6.4 oz (99.1 kg)   LMP 09/04/2016 (Exact Date)   SpO2 100%   BMI 28.81 kg/m   BP/Weight 10/02/2016 09/14/2016 09/07/3333  Systolic BP 456 256 389  Diastolic BP 77 83 79  Wt. (Lbs) 218.4 - -  BMI 28.81 - -      Physical Exam  Constitutional: She is oriented to person, place, and time.  Well developed, well nourished, NAD, polite  HENT:  Head: Normocephalic and atraumatic.  Eyes: No scleral icterus.  Neck: Normal range of motion. Neck supple. No thyromegaly present.  Cardiovascular: Normal rate, regular rhythm and normal heart sounds.   Pulmonary/Chest: Effort normal and breath sounds normal.  Abdominal: Soft. Bowel sounds are normal. She exhibits no distension. There is tenderness (mild epigastric TTP). There is no rebound and no guarding.  Musculoskeletal: Normal range of motion. She exhibits no edema, tenderness or deformity.  Neurological: She is alert and oriented to person, place, and time.  Skin: Skin is warm and dry. No rash noted. No erythema. No pallor.  Callus on right forefoot  Psychiatric: She has a normal mood and affect. Her behavior is normal. Thought content normal.  Vitals reviewed.    Assessment & Plan:   1. Gastroesophageal reflux disease, esophagitis presence not specified - Omeprazole 40mg  - H pylori IgG  2. Paresthesia of both feet - Burning sensation, ddx  vit b12 and folate deficiency, CKD - HgB A1c - TSH - gabapentin (NEURONTIN) 100 MG capsule; Take 1 capsule (100 mg total) by mouth 3 (three) times daily.  Dispense: 90 capsule; Refill: 1  3. Abdominal pain, epigastric - Stop NSAID use - CBC with Differential/Platelet - Comprehensive metabolic panel - H.  pylori antibody, IgG - Lipase - omeprazole (PRILOSEC) 40 MG capsule; Take 1 capsule (40 mg total) by mouth daily.  Dispense: 30 capsule; Refill: 1  4. Constipation, unspecified constipation type - senna-docusate (SENOKOT-S) 8.6-50 MG tablet; Take 1 tablet by mouth daily as needed for mild constipation.  Dispense: 30 tablet; Refill: 0  5. Tinnitus of both ears - No pharmacotherapy. Recommended white noise machine.  6. Eye irritation - Possibly allergic. Will recommend referral to ophthalmology if persistent. - ketotifen (ZADITOR) 0.025 % ophthalmic solution; Place 1 drop into both eyes 2 (two) times daily.  Dispense: 5 mL; Refill: 0  7. Chronic left-sided low back pain without sciatica - Chronic, unclear at this point and patient is currently asymptomatic. Will order XR and/or PT if becomes acute/bothersome.  8. Polyuria - Urinalysis Dipstick with trace leukocytes. No tx at this time as this seems to be asymptomatic pyuria. Will retest at upcoming appt. - HgB A1c 5.4% in clinic today - TSH  9. Polydipsia - Urinalysis Dipstick with trace leukocytes. - HgB A1c 5.4% in clinic today - TSH   Meds ordered this encounter  Medications  . senna-docusate (SENOKOT-S) 8.6-50 MG tablet    Sig: Take 1 tablet by mouth daily as needed for mild constipation.    Dispense:  30 tablet    Refill:  0    Order Specific Question:   Supervising Provider    Answer:   Tresa Garter [id number]  . ketotifen (ZADITOR) 0.025 % ophthalmic solution    Sig: Place 1 drop into both eyes 2 (two) times daily.    Dispense:  5 mL    Refill:  0    Order Specific Question:   Supervising Provider    Answer:   Tresa Garter [id number]  . omeprazole (PRILOSEC) 40 MG capsule    Sig: Take 1 capsule (40 mg total) by mouth daily.    Dispense:  30 capsule    Refill:  1    Order Specific Question:   Supervising Provider    Answer:   Tresa Garter [id number]  . gabapentin (NEURONTIN) 100 MG capsule     Sig: Take 1 capsule (100 mg total) by mouth 3 (three) times daily.    Dispense:  90 capsule    Refill:  1    Order Specific Question:   Supervising Provider    Answer:   Tresa Garter [[id number]]    Follow-up: Return in about 4 weeks (around 10/30/2016) for f/u multiple complaints.   Clent Demark PA

## 2016-10-05 ENCOUNTER — Other Ambulatory Visit (INDEPENDENT_AMBULATORY_CARE_PROVIDER_SITE_OTHER): Payer: Self-pay | Admitting: Physician Assistant

## 2016-10-05 DIAGNOSIS — R202 Paresthesia of skin: Secondary | ICD-10-CM

## 2016-10-05 DIAGNOSIS — R1013 Epigastric pain: Secondary | ICD-10-CM

## 2016-10-05 DIAGNOSIS — A048 Other specified bacterial intestinal infections: Secondary | ICD-10-CM

## 2016-10-05 LAB — CBC WITH DIFFERENTIAL/PLATELET
BASOS: 1 %
Basophils Absolute: 0 10*3/uL (ref 0.0–0.2)
EOS (ABSOLUTE): 0 10*3/uL (ref 0.0–0.4)
Eos: 1 %
HEMOGLOBIN: 13.1 g/dL (ref 11.1–15.9)
Hematocrit: 37.9 % (ref 34.0–46.6)
IMMATURE GRANS (ABS): 0 10*3/uL (ref 0.0–0.1)
IMMATURE GRANULOCYTES: 0 %
LYMPHS: 53 %
Lymphocytes Absolute: 1.9 10*3/uL (ref 0.7–3.1)
MCH: 30.7 pg (ref 26.6–33.0)
MCHC: 34.6 g/dL (ref 31.5–35.7)
MCV: 89 fL (ref 79–97)
MONOCYTES: 7 %
Monocytes Absolute: 0.3 10*3/uL (ref 0.1–0.9)
NEUTROS ABS: 1.4 10*3/uL (ref 1.4–7.0)
Neutrophils: 38 %
Platelets: 359 10*3/uL (ref 150–379)
RBC: 4.27 x10E6/uL (ref 3.77–5.28)
RDW: 13.4 % (ref 12.3–15.4)
WBC: 3.6 10*3/uL (ref 3.4–10.8)

## 2016-10-05 LAB — COMPREHENSIVE METABOLIC PANEL
A/G RATIO: 1.4 (ref 1.2–2.2)
ALBUMIN: 4.4 g/dL (ref 3.5–5.5)
ALT: 15 IU/L (ref 0–32)
AST: 18 IU/L (ref 0–40)
Alkaline Phosphatase: 32 IU/L — ABNORMAL LOW (ref 39–117)
BILIRUBIN TOTAL: 0.7 mg/dL (ref 0.0–1.2)
BUN / CREAT RATIO: 6 — AB (ref 9–23)
BUN: 5 mg/dL — AB (ref 6–24)
CALCIUM: 9.4 mg/dL (ref 8.7–10.2)
CO2: 25 mmol/L (ref 18–29)
CREATININE: 0.89 mg/dL (ref 0.57–1.00)
Chloride: 100 mmol/L (ref 96–106)
GFR calc Af Amer: 90 mL/min/{1.73_m2} (ref >59)
GFR, EST NON AFRICAN AMERICAN: 78 mL/min/{1.73_m2} (ref >59)
Globulin, Total: 3.1 g/dL (ref 1.5–4.5)
Glucose: 78 mg/dL (ref 65–99)
Potassium: 4.1 mmol/L (ref 3.5–5.2)
Sodium: 139 mmol/L (ref 134–144)
TOTAL PROTEIN: 7.5 g/dL (ref 6.0–8.5)

## 2016-10-05 LAB — H. PYLORI ANTIBODY, IGG: H. pylori, IgG AbS: <0.8 Index Value (ref 0.00–0.79)

## 2016-10-05 LAB — TSH: TSH: 2 u[IU]/mL (ref 0.450–4.500)

## 2016-10-05 LAB — LIPASE: Lipase: 44 U/L (ref 14–72)

## 2016-10-05 MED ORDER — AMOXICILLIN 500 MG PO TABS: 1000.0000 mg | ORAL_TABLET | Freq: Two times a day (BID) | ORAL | 0 refills | Status: AC

## 2016-10-05 MED ORDER — OMEPRAZOLE 40 MG PO CPDR: 40.0000 mg | DELAYED_RELEASE_CAPSULE | Freq: Every day | ORAL | 1 refills | Status: DC

## 2016-10-05 MED ORDER — CLARITHROMYCIN 500 MG PO TABS: 500.0000 mg | ORAL_TABLET | Freq: Two times a day (BID) | ORAL | 0 refills | Status: AC

## 2016-10-05 MED ORDER — GABAPENTIN 100 MG PO CAPS: 100.0000 mg | ORAL_CAPSULE | Freq: Three times a day (TID) | ORAL | 1 refills | Status: DC

## 2016-10-05 MED FILL — AMOXICILLIN 500 MG CAPSULE: 500 | 14 days supply | Qty: 56 | Fill #0

## 2016-10-05 MED FILL — CLARITHROMYCIN 500 MG TAB: 500 | 14 days supply | Qty: 28 | Fill #0

## 2016-10-05 NOTE — Progress Notes (Signed)
Equivocal H pylori.

## 2016-10-05 NOTE — Telephone Encounter (Signed)
Patient called stated would like Rx sent to [facility]  gabapentin (NEURONTIN) 100 MG capsule   omeprazole (PRILOSEC) 40 MG capsule  Please call patient when have been sent.  Ph:  [phone]

## 2016-10-05 NOTE — Telephone Encounter (Signed)
Notified patient that medications were sent to [facility], pick up at [place] Nat Christen, CMA

## 2016-10-05 NOTE — Addendum Note (Signed)
Addended by: Nat Christen on: 10/05/2016 10:39 AM   Modules accepted: Orders

## 2016-10-30 ENCOUNTER — Encounter (INDEPENDENT_AMBULATORY_CARE_PROVIDER_SITE_OTHER): Payer: Self-pay | Admitting: Physician Assistant

## 2016-10-30 ENCOUNTER — Ambulatory Visit (INDEPENDENT_AMBULATORY_CARE_PROVIDER_SITE_OTHER): Payer: Self-pay | Admitting: Physician Assistant

## 2016-10-30 ENCOUNTER — Encounter: Payer: Self-pay | Admitting: Gastroenterology

## 2016-10-30 VITALS — BP 118/71 | HR 74 | Temp 98.4°F | Wt 223.6 lb

## 2016-10-30 DIAGNOSIS — K8020 Calculus of gallbladder without cholecystitis without obstruction: Secondary | ICD-10-CM

## 2016-10-30 DIAGNOSIS — R202 Paresthesia of skin: Secondary | ICD-10-CM

## 2016-10-30 DIAGNOSIS — R1011 Right upper quadrant pain: Secondary | ICD-10-CM

## 2016-10-30 DIAGNOSIS — K2100 Gastro-esophageal reflux disease with esophagitis, without bleeding: Secondary | ICD-10-CM

## 2016-10-30 DIAGNOSIS — R112 Nausea with vomiting, unspecified: Secondary | ICD-10-CM

## 2016-10-30 MED ORDER — ONDANSETRON HCL 4 MG PO TABS: 4.0000 mg | ORAL_TABLET | Freq: Three times a day (TID) | ORAL | 0 refills | Status: DC | PRN

## 2016-10-30 MED ORDER — ESOMEPRAZOLE MAGNESIUM 40 MG PO CPDR: 40.0000 mg | DELAYED_RELEASE_CAPSULE | Freq: Every day | ORAL | 3 refills | Status: DC

## 2016-10-30 MED ORDER — CELECOXIB 200 MG PO CAPS: 200.0000 mg | ORAL_CAPSULE | Freq: Two times a day (BID) | ORAL | 0 refills | Status: DC

## 2016-10-30 MED FILL — ?CELECOXIB 200 MG CAPSULE: 200 MG | 30 days supply | Qty: 60 | Fill #0

## 2016-10-30 MED FILL — ?ESOMEPRAZOLE MAG DR 40 MG: 40 MG | 30 days supply | Qty: 30 | Fill #0

## 2016-10-30 MED FILL — ?ONDANSETRON HCL 4 MG TABLE: 4 | 6 days supply | Qty: 20 | Fill #0

## 2016-10-30 NOTE — Patient Instructions (Signed)
GO TO THE EMRGENCY ROOM IF YOU FEEL WORSENING PAIN, NAUSEA, VOMITING, FEVER, CHILLS, OR ANY OTHER WORRISOME SYMPTOMS  Cholelithiasis Cholelithiasis is a form of gallbladder disease in which gallstones form in the gallbladder. The gallbladder is an organ that stores bile. Bile is made in the liver, and it helps to digest fats. Gallstones begin as small crystals and slowly grow into stones. They may cause no symptoms until the gallbladder tightens (contracts) and a gallstone is blocking the duct (gallbladder attack), which can cause pain. Cholelithiasis is also referred to as gallstones. There are two main types of gallstones:  Cholesterol stones. These are made of hardened cholesterol and are usually yellow-green in color. They are the most common type of gallstone. Cholesterol is a white, waxy, fat-like substance that is made in the liver.  Pigment stones. These are dark in color and are made of a red-yellow substance that forms when hemoglobin from red blood cells breaks down (bilirubin). What are the causes? This condition may be caused by an imbalance in the substances that bile is made of. This can happen if the bile:  Has too much bilirubin.  Has too much cholesterol.  Does not have enough bile salts. These salts help the body absorb and digest fats. In some cases, this condition can also be caused by the gallbladder not emptying completely or often enough. What increases the risk? The following factors may make you more likely to develop this condition:  Being female.  Having multiple pregnancies. Health care providers sometimes advise removing diseased gallbladders before future pregnancies.  Eating a diet that is heavy in fried foods, fat, and refined carbohydrates, like white bread and white rice.  Being obese.  Being older than age 32.  Prolonged use of medicines that contain female hormones (estrogen).  Having diabetes mellitus.  Rapidly losing weight.  Having a  family history of gallstones.  Being of [place] or [place] descent.  Having an intestinal disease such as Crohn disease.  Having metabolic syndrome.  Having cirrhosis.  Having severe types of anemia such as sickle cell anemia. What are the signs or symptoms? In most cases, there are no symptoms. These are known as silent gallstones. If a gallstone blocks the bile ducts, it can cause a gallbladder attack. The main symptom of a gallbladder attack is sudden pain in the upper right abdomen. The pain usually comes at night or after eating a large meal. The pain can last for one or several hours and can spread to the right shoulder or chest. If the bile duct is blocked for more than a few hours, it can cause infection or inflammation of the gallbladder, liver, or pancreas, which may cause:  Nausea.  Vomiting.  Abdominal pain that lasts for 5 hours or more.  Fever or chills.  Yellowing of the skin or the whites of the eyes (jaundice).  Dark urine.  Light-colored stools. How is this diagnosed? This condition may be diagnosed based on:  A physical exam.  Your medical history.  An ultrasound of your gallbladder.  CT scan.  MRI.  Blood tests to check for signs of infection or inflammation.  A scan of your gallbladder and bile ducts (biliary system) using nonharmful radioactive material and special cameras that can see the radioactive material (cholescintigram). This test checks to see how your gallbladder contracts and whether bile ducts are blocked.  Inserting a small tube with a camera on the end (endoscope) through your mouth to inspect bile  ducts and check for blockages (endoscopic retrograde cholangiopancreatogram). How is this treated? Treatment for gallstones depends on the severity of the condition. Silent gallstones do not need treatment. If the gallstones cause a gallbladder attack or other symptoms, treatment may be required. Options for treatment  include:  Surgery to remove the gallbladder (cholecystectomy). This is the most common treatment.  Medicines to dissolve gallstones. These are most effective at treating small gallstones. You may need to take medicines for up to 6-12 months.  Shock wave treatment (extracorporeal biliary lithotripsy). In this treatment, an ultrasound machine sends shock waves to the gallbladder to break gallstones into smaller pieces. These pieces can then be passed into the intestines or be dissolved by medicine. This is rarely used.  Removing gallstones through endoscopic retrograde cholangiopancreatogram. A small basket can be attached to the endoscope and used to capture and remove gallstones. Follow these instructions at home:  Take over-the-counter and prescription medicines only as told by your health care provider.  Maintain a healthy weight and follow a healthy diet. This includes:  Reducing fatty foods, such as fried food.  Reducing refined carbohydrates, like white bread and white rice.  Increasing fiber. Aim for foods like almonds, fruit, and beans.  Keep all follow-up visits as told by your health care provider. This is important. Contact a health care provider if:  You think you have had a gallbladder attack.  You have been diagnosed with silent gallstones and you develop abdominal pain or indigestion. Get help right away if:  You have pain from a gallbladder attack that lasts for more than 2 hours.  You have abdominal pain that lasts for more than 5 hours.  You have a fever or chills.  You have persistent nausea and vomiting.  You develop jaundice.  You have dark urine or light-colored stools. Summary  Cholelithiasis (also called gallstones) is a form of gallbladder disease in which gallstones form in the gallbladder.  This condition is caused by an imbalance in the substances that make up bile. This can happen if the bile has too much cholesterol, too much bilirubin, or not  enough bile salts.  You are more likely to develop this condition if you are female, pregnant, using medicines with estrogen, obese, older than age 31, or have a family history of gallstones. You may also develop gallstones if you have diabetes, an intestinal disease, cirrhosis, or metabolic syndrome.  Treatment for gallstones depends on the severity of the condition. Silent gallstones do not need treatment.  If gallstones cause a gallbladder attack or other symptoms, treatment may be needed. The most common treatment is surgery to remove the gallbladder. This information is not intended to replace advice given to you by your health care provider. Make sure you discuss any questions you have with your health care provider. Document Released: 06/25/2005 Document Revised: 03/15/2016 Document Reviewed: 03/15/2016 Elsevier Interactive Patient Education  2017 [employer].

## 2016-10-30 NOTE — Progress Notes (Signed)
Subjective:  Patient ID: Haley Chandler, female    DOB: 1970-11-21  Age: 46 y.o. MRN: [id number]  CC: f/u abdominal pain and GERD  HPI Haley Chandler is a 46 y.o. female presents on f/u of abdominal pain from previous visit. She was found to have cholelithiasis w/o evidence of acute cholecystitis by [place] in 05/28/16. States that she still has this pain in the RUQ and is "unbearable" at times. Pain is noted to be low level at this time, no current nausea. Nausea is usually present in the morning upon awakening. There also seems to be occasional right shoulder pain but pain sometimes "jumps" to the left side. Patient further describes a "dryness" in her throat. Feels that foods are stuck in her throat when swallowing. Same does not happen with liquids. She requests something other than omeprazole. Does not feel relief with omeprazole. She was found to have equivocal result on H pylori IgG testing. Finished her two antibiotics but still feels the same in regards to epigastric discomfort. Constipation is relieved since taking Senokot-S. Also shares that her bilateral lower extremity burning is resolved with Gabapentin 100mg  TID and Vit B12. Denies any other symptoms.    Review of Systems  Constitutional: Negative for chills, fever and malaise/fatigue.  Eyes: Negative for blurred vision.  Respiratory: Negative for shortness of breath.   Cardiovascular: Negative for chest pain and palpitations.  Gastrointestinal: Positive for abdominal pain and nausea. Negative for vomiting.  Genitourinary: Negative for dysuria and hematuria.  Musculoskeletal: Positive for joint pain. Negative for myalgias.  Skin: Negative for itching and rash.  Neurological: Negative for tingling and headaches.  Psychiatric/Behavioral: Negative for depression. The patient is not nervous/anxious.     Objective:  BP 118/71   Pulse 74   Temp 98.4 F (36.9 C) (Oral)   Wt 223 lb 9.6 oz (101.4 kg)   LMP 10/26/2016 (Approximate)   SpO2  100%   BMI 29.50 kg/m   BP/Weight 10/30/2016 2/70/3500 03/15/8181  Systolic BP 993 716 967  Diastolic BP 71 77 83  Wt. (Lbs) 223.6 218.4 -  BMI 29.5 28.81 -      Physical Exam  Constitutional: She is oriented to person, place, and time.  Well developed, well nourished, NAD, polite  HENT:  Head: Normocephalic and atraumatic.  Mouth/Throat: No oropharyngeal exudate.  Eyes: No scleral icterus.  Neck: Normal range of motion. Neck supple. No thyromegaly present.  Cardiovascular: Normal rate, regular rhythm and normal heart sounds.   Pulmonary/Chest: Effort normal and breath sounds normal.  Abdominal: Soft. Bowel sounds are normal. She exhibits no distension. There is tenderness (RUQ and epigastric TTP). There is no rebound and no guarding.  Musculoskeletal: She exhibits no edema.  Neurological: She is alert and oriented to person, place, and time. No cranial nerve deficit. Coordination normal.  Skin: Skin is warm and dry. No rash noted. No erythema. No pallor.  Psychiatric: She has a normal mood and affect. Her behavior is normal. Thought content normal.  Vitals reviewed.    Assessment & Plan:   1. Gastroesophageal reflux disease with esophagitis - Ambulatory referral to Gastroenterology. Possible EGD. - esomeprazole (NEXIUM) 40 MG capsule; Take 1 capsule (40 mg total) by mouth daily.  Dispense: 30 capsule; Refill: 3  2. Calculus of gallbladder without cholecystitis without obstruction - Patient not acute, however, I have educated her on red flag sxs and the need to go to ED should she feel worse. I also advised to drink more water and abstain from  fatty foods. It seems patient did not know about abstaining from fatty foods. Pt said she will comply and go to ED if necessary. - celecoxib (CELEBREX) 200 MG capsule; Take 1 capsule (200 mg total) by mouth 2 (two) times daily.  Dispense: 60 capsule; Refill: 0 - Ambulatory referral to General Surgery  3. RUQ abdominal pain - celecoxib  (CELEBREX) 200 MG capsule; Take 1 capsule (200 mg total) by mouth 2 (two) times daily.  Dispense: 60 capsule; Refill: 0 - Ambulatory referral to General Surgery  4. Paresthesia of both lower extremities -Resolved with gabapentin 100 mg TID and Vit B12.   Meds ordered this encounter  Medications  . celecoxib (CELEBREX) 200 MG capsule    Sig: Take 1 capsule (200 mg total) by mouth 2 (two) times daily.    Dispense:  60 capsule    Refill:  0    Order Specific Question:   Supervising Provider    Answer:   Tresa Garter [id number]  . esomeprazole (NEXIUM) 40 MG capsule    Sig: Take 1 capsule (40 mg total) by mouth daily.    Dispense:  30 capsule    Refill:  3    Order Specific Question:   Supervising Provider    Answer:   Tresa Garter [id number]    Follow-up: Return if symptoms worsen or fail to improve.   Clent Demark PA

## 2016-11-25 ENCOUNTER — Ambulatory Visit: Payer: Self-pay

## 2016-11-27 ENCOUNTER — Ambulatory Visit: Payer: Self-pay | Admitting: Gastroenterology

## 2016-11-27 ENCOUNTER — Other Ambulatory Visit: Payer: Self-pay

## 2016-12-01 ENCOUNTER — Encounter: Payer: Self-pay | Admitting: Physician Assistant

## 2016-12-04 ENCOUNTER — Encounter (HOSPITAL_COMMUNITY): Payer: Self-pay | Admitting: Emergency Medicine

## 2016-12-04 ENCOUNTER — Ambulatory Visit (HOSPITAL_COMMUNITY)
Admission: EM | Admit: 2016-12-04 | Discharge: 2016-12-06 | Disposition: A | Discharge status: Home / Self Care | Payer: Self-pay | Attending: Surgery | Admitting: Surgery

## 2016-12-04 ENCOUNTER — Emergency Department (HOSPITAL_COMMUNITY): Payer: Self-pay

## 2016-12-04 ENCOUNTER — Ambulatory Visit (INDEPENDENT_AMBULATORY_CARE_PROVIDER_SITE_OTHER): Payer: Self-pay | Admitting: Physician Assistant

## 2016-12-04 ENCOUNTER — Encounter (INDEPENDENT_AMBULATORY_CARE_PROVIDER_SITE_OTHER): Payer: Self-pay | Admitting: Physician Assistant

## 2016-12-04 VITALS — BP 101/67 | HR 62 | Temp 97.7°F | Wt 219.8 lb

## 2016-12-04 DIAGNOSIS — K8010 Calculus of gallbladder with chronic cholecystitis without obstruction: Secondary | ICD-10-CM | POA: Insufficient documentation

## 2016-12-04 DIAGNOSIS — Z79899 Other long term (current) drug therapy: Secondary | ICD-10-CM | POA: Insufficient documentation

## 2016-12-04 DIAGNOSIS — K8000 Calculus of gallbladder with acute cholecystitis without obstruction: Secondary | ICD-10-CM

## 2016-12-04 DIAGNOSIS — K8020 Calculus of gallbladder without cholecystitis without obstruction: Secondary | ICD-10-CM | POA: Diagnosis present

## 2016-12-04 DIAGNOSIS — R5383 Other fatigue: Secondary | ICD-10-CM

## 2016-12-04 DIAGNOSIS — Z9851 Tubal ligation status: Secondary | ICD-10-CM | POA: Insufficient documentation

## 2016-12-04 DIAGNOSIS — R1011 Right upper quadrant pain: Secondary | ICD-10-CM

## 2016-12-04 DIAGNOSIS — K2100 Gastro-esophageal reflux disease with esophagitis, without bleeding: Secondary | ICD-10-CM

## 2016-12-04 DIAGNOSIS — M25511 Pain in right shoulder: Secondary | ICD-10-CM

## 2016-12-04 DIAGNOSIS — K810 Acute cholecystitis: Secondary | ICD-10-CM | POA: Diagnosis present

## 2016-12-04 LAB — URINALYSIS, ROUTINE W REFLEX MICROSCOPIC
Bacteria, UA: NONE SEEN
Bilirubin Urine: NEGATIVE
Glucose, UA: NEGATIVE mg/dL
Ketones, ur: NEGATIVE mg/dL
Leukocytes, UA: NEGATIVE
Nitrite: NEGATIVE
Protein, ur: NEGATIVE mg/dL
SPECIFIC GRAVITY, URINE: 1.004 — AB (ref 1.005–1.030)
WBC, UA: NONE SEEN WBC/hpf (ref 0–5)
pH: 7 (ref 5.0–8.0)

## 2016-12-04 LAB — POCT URINALYSIS DIPSTICK
Glucose, UA: NEGATIVE
Ketones, UA: NEGATIVE
Leukocytes, UA: NEGATIVE
Nitrite, UA: NEGATIVE
Protein, UA: NEGATIVE
Spec Grav, UA: 1.02 (ref 1.010–1.025)
Urobilinogen, UA: 0.2 E.U./dL
pH, UA: 5 (ref 5.0–8.0)

## 2016-12-04 LAB — COMPREHENSIVE METABOLIC PANEL
ALBUMIN: 3.8 g/dL (ref 3.5–5.0)
ALT: 22 U/L (ref 14–54)
AST: 24 U/L (ref 15–41)
Alkaline Phosphatase: 31 U/L — ABNORMAL LOW (ref 38–126)
Anion gap: 6 (ref 5–15)
BUN: 13 mg/dL (ref 6–20)
CHLORIDE: 107 mmol/L (ref 101–111)
CO2: 24 mmol/L (ref 22–32)
Calcium: 8.9 mg/dL (ref 8.9–10.3)
Creatinine, Ser: 0.94 mg/dL (ref 0.44–1.00)
GFR calc Af Amer: >60 mL/min (ref >60)
GFR calc non Af Amer: >60 mL/min (ref >60)
GLUCOSE: 102 mg/dL — AB (ref 65–99)
POTASSIUM: 3.6 mmol/L (ref 3.5–5.1)
Sodium: 137 mmol/L (ref 135–145)
Total Bilirubin: 0.6 mg/dL (ref 0.3–1.2)
Total Protein: 6.7 g/dL (ref 6.5–8.1)

## 2016-12-04 LAB — CBC
HEMATOCRIT: 35.7 % — AB (ref 36.0–46.0)
Hemoglobin: 11.6 g/dL — ABNORMAL LOW (ref 12.0–15.0)
MCH: 30.1 pg (ref 26.0–34.0)
MCHC: 32.5 g/dL (ref 30.0–36.0)
MCV: 92.5 fL (ref 78.0–100.0)
Platelets: 368 10*3/uL (ref 150–400)
RBC: 3.86 MIL/uL — ABNORMAL LOW (ref 3.87–5.11)
RDW: 12.9 % (ref 11.5–15.5)
WBC: 4.5 10*3/uL (ref 4.0–10.5)

## 2016-12-04 LAB — POCT URINE PREGNANCY: Preg Test, Ur: NEGATIVE

## 2016-12-04 LAB — LIPASE, BLOOD: LIPASE: 41 U/L (ref 11–51)

## 2016-12-04 LAB — D-DIMER, QUANTITATIVE: D-Dimer, Quant: 0.38 ug/mL-FEU (ref 0.00–0.50)

## 2016-12-04 MED ORDER — ESOMEPRAZOLE MAGNESIUM 40 MG PO CPDR: 40.0000 mg | DELAYED_RELEASE_CAPSULE | Freq: Every day | ORAL | 3 refills | Status: DC

## 2016-12-04 MED ORDER — ACETAMINOPHEN-CODEINE #3 300-30 MG PO TABS: 1.0000 | ORAL_TABLET | ORAL | 0 refills | Status: AC | PRN

## 2016-12-04 MED ORDER — MORPHINE SULFATE (PF) 4 MG/ML IV SOLN
4.0000 mg | Freq: Once | INTRAVENOUS | Status: AC
  Administered 2016-12-04: 4 mg via INTRAVENOUS
  Filled 2016-12-04: qty 1

## 2016-12-04 MED ORDER — SODIUM CHLORIDE 0.9 % IV BOLUS (SEPSIS)
1000.0000 mL | Freq: Once | INTRAVENOUS | Status: AC
  Administered 2016-12-04: 1000 mL via INTRAVENOUS

## 2016-12-04 NOTE — Patient Instructions (Signed)
Please proceed to the emergency room. I suspect that your gallbladder is inflamed and may likely need to be removed.   Cholecystitis Cholecystitis is inflammation of the gallbladder. It is often called a gallbladder attack. The gallbladder is a pear-shaped organ that lies beneath the liver on the right side of the body. The gallbladder stores bile, which is a fluid that helps the body to digest fats. If bile builds up in your gallbladder, your gallbladder becomes inflamed. This condition may occur suddenly (be acute). Repeat episodes of acute cholecystitis or prolonged episodes may lead to a long-term (chronic) condition. Cholecystitis is serious and it requires treatment. What are the causes? The most common cause of this condition is gallstones. Gallstones can block the tube (duct) that carries bile out of your gallbladder. This causes bile to build up. Other causes of this condition include:  Damage to the gallbladder due to a decrease in blood flow.  Infections in the bile ducts.  Scars or kinks in the bile ducts.  Tumors in the liver, pancreas, or gallbladder. What increases the risk? This condition is more likely to develop in:  People who have sickle cell disease.  People who take birth control pills or use estrogen.  People who have alcoholic liver disease.  People who have liver cirrhosis.  People who have their nutrition delivered through a vein (parenteral nutrition).  People who do not eat or drink (do fasting) for a long period of time.  People who are obese.  People who have rapid weight loss.  People who are pregnant.  People who have increased triglyceride levels.  People who have pancreatitis. What are the signs or symptoms? Symptoms of this condition include:  Abdominal pain, especially in the upper right area of the abdomen.  Abdominal tenderness or bloating.  Nausea.  Vomiting.  Fever.  Chills.  Yellowing of the skin and the whites of the eyes  (jaundice). How is this diagnosed? This condition is diagnosed with a medical history and physical exam. You may also have other tests, including:  Imaging tests, such as:  An ultrasound of the gallbladder.  A CT scan of the abdomen.  A gallbladder nuclear scan (HIDA scan). This scan allows your health care provider to see the bile moving from your liver to your gallbladder and to your small intestine.  MRI.  Blood tests, such as:  A complete blood count, because the white blood cell count may be higher than normal.  Liver function tests, because some levels may be higher than normal with certain types of gallstones. How is this treated? Treatment may include:  Fasting for a certain amount of time.  IV fluids.  Medicine to treat pain or vomiting.  Antibiotic medicine.  Surgery to remove your gallbladder (cholecystectomy). This may happen immediately or at a later time. Follow these instructions at home: Home care will depend on your treatment. In general:  Take over-the-counter and prescription medicines only as told by your health care provider.  If you were prescribed an antibiotic medicine, take it as told by your health care provider. Do not stop taking the antibiotic even if you start to feel better.  Follow instructions from your health care provider about what to eat or drink. When you are allowed to eat, avoid eating or drinking anything that triggers your symptoms.  Keep all follow-up visits as told by your health care provider. This is important. Contact a health care provider if:  Your pain is not controlled with medicine.  You have a fever. Get help right away if:  Your pain moves to another part of your abdomen or to your back.  You continue to have symptoms or you develop new symptoms even with treatment. This information is not intended to replace advice given to you by your health care provider. Make sure you discuss any questions you have with your  health care provider. Document Released: 06/29/2005 Document Revised: 11/07/2015 Document Reviewed: 10/10/2014 Elsevier Interactive Patient Education  2017 [employer].

## 2016-12-04 NOTE — ED Triage Notes (Signed)
Pt sent here by PCP for possible gallbladdwer issues. Pt c/o RUQ abdominal pain for six months

## 2016-12-04 NOTE — ED Notes (Signed)
Pt made aware of bed assignment 

## 2016-12-04 NOTE — Progress Notes (Signed)
Subjective:  Patient ID: Haley Chandler, female    DOB: 1970/08/04  Age: 46 y.o. MRN: [id number]  CC: RUQ pain, fatigue, right shoulder pain  HPI Haley Chandler is a 46 y.o. female with a PMH of cholelithiasis present with RUQ pain that has progressed to constant pain for more than a month. She was found to have cholelithiasis without evidence of acute cholecystitis by RUQ [place] on 05/28/16.  She is currently with nausea and has postprandial nausea without vomiting.  Denies consuming fatty/greasy foods. Drinks plenty of water. Does not endorse CP, palpitations, SOB, HA, rash, or fever/chills.    Outpatient Medications Prior to Visit  Medication Sig Dispense Refill  . celecoxib (CELEBREX) 200 MG capsule Take 1 capsule (200 mg total) by mouth 2 (two) times daily. 60 capsule 0  . ketotifen (ZADITOR) 0.025 % ophthalmic solution Place 1 drop into both eyes 2 (two) times daily. 5 mL 0  . ondansetron (ZOFRAN) 4 MG tablet Take 1 tablet (4 mg total) by mouth every 8 (eight) hours as needed for nausea or vomiting. 20 tablet 0  . senna-docusate (SENOKOT-S) 8.6-50 MG tablet Take 1 tablet by mouth daily as needed for mild constipation. 30 tablet 0  . esomeprazole (NEXIUM) 40 MG capsule Take 1 capsule (40 mg total) by mouth daily. 30 capsule 3  . gabapentin (NEURONTIN) 100 MG capsule Take 1 capsule (100 mg total) by mouth 3 (three) times daily. 90 capsule 1  . sucralfate (CARAFATE) 1 GM/10ML suspension Take 10 mLs (1 g total) by mouth 4 (four) times daily -  with meals and at bedtime. (Patient not taking: Reported on 10/02/2016) 420 mL 1   No facility-administered medications prior to visit.      ROS Review of Systems  Constitutional: Negative for chills, fever and malaise/fatigue.  Eyes: Negative for blurred vision.  Respiratory: Negative for shortness of breath.   Cardiovascular: Negative for chest pain and palpitations.  Gastrointestinal: Positive for abdominal pain and nausea. Negative for blood in stool,  constipation, diarrhea and vomiting.  Genitourinary: Negative for dysuria and hematuria.  Musculoskeletal: Negative for joint pain and myalgias.  Skin: Negative for rash.  Neurological: Negative for tingling and headaches.  Psychiatric/Behavioral: Negative for depression. The patient is not nervous/anxious.     Objective:  BP 101/67 (BP Location: Left Arm, Patient Position: Sitting, Cuff Size: Large)   Pulse 62   Temp 97.7 F (36.5 C) (Oral)   Wt 219 lb 12.8 oz (99.7 kg)   LMP 11/24/2016 (Approximate)   SpO2 100%   BMI 29.00 kg/m   BP/Weight 12/04/2016 10/30/2016 10/19/8117  Systolic BP 147 829 562  Diastolic BP 67 71 77  Wt. (Lbs) 219.8 223.6 218.4  BMI 29 29.5 28.81      Physical Exam  Constitutional: She is oriented to person, place, and time.  Well developed, well nourished, appears to be in discomfort, polite  HENT:  Head: Normocephalic and atraumatic.  Eyes: No scleral icterus.  Cardiovascular: Normal rate, regular rhythm and normal heart sounds.   Pulmonary/Chest: Effort normal and breath sounds normal.  Abdominal: Soft. Bowel sounds are normal. She exhibits no distension. There is tenderness. There is guarding.  Murphy's sign positive.   Musculoskeletal: She exhibits no edema.  Neurological: She is alert and oriented to person, place, and time. No cranial nerve deficit. Coordination normal.  Skin: Skin is warm and dry. No rash noted. No erythema. No pallor.  Psychiatric: She has a normal mood and affect. Her behavior is normal. Thought  content normal.  Vitals reviewed.    Assessment & Plan:   1. RUQ pain - Begin acetaminophen-codeine (TYLENOL #3) 300-30 MG tablet; Take 1 tablet by mouth every 4 (four) hours as needed for moderate pain.  Dispense: 42 tablet; Refill: 0  2. Acute pain of right shoulder - Likely referred pain from cholecystitis. - Begin acetaminophen-codeine (TYLENOL #3) 300-30 MG tablet; Take 1 tablet by mouth every 4 (four) hours as needed for  moderate pain.  Dispense: 42 tablet; Refill: 0  3. Fatigue, unspecified type - POCT urine pregnancy Negative in clinic today - Urinalysis Dipstick unremarkable   4. Gastroesophageal reflux disease with esophagitis - Refill esomeprazole (NEXIUM) 40 MG capsule; Take 1 capsule (40 mg total) by mouth daily.  Dispense: 30 capsule; Refill: 3   Meds ordered this encounter  Medications  . acetaminophen-codeine (TYLENOL #3) 300-30 MG tablet    Sig: Take 1 tablet by mouth every 4 (four) hours as needed for moderate pain.    Dispense:  42 tablet    Refill:  0    Order Specific Question:   Supervising Provider    Answer:   Tresa Garter [id number]  . esomeprazole (NEXIUM) 40 MG capsule    Sig: Take 1 capsule (40 mg total) by mouth daily.    Dispense:  30 capsule    Refill:  3    Order Specific Question:   Supervising Provider    Answer:   Tresa Garter [[id number]]    Follow-up: Return in about 2 weeks (around 12/18/2016) for f/u of RUQ [place].   Clent Demark PA

## 2016-12-04 NOTE — ED Provider Notes (Signed)
[facility] DEPT Provider Note   CSN: [id number] Arrival date & time: 12/04/16  1337     History   Chief Complaint Chief Complaint  Patient presents with  . Abdominal Pain    HPI Haley Chandler is a 46 y.o. female history of biliary colic here presenting with right upper quadrant pain, shoulder pain. Patient states that she has right upper quadrant pain for the last year so. States that this is worse after she eats. Has some associated right shoulder pain as well. Patient was seen a primary care office this morning and was sent for possible cholecystitis versus biliary colic. She states that her pain has been gradually worsening over the last week or so. Denies any nausea or vomiting or fevers. Has some occasional shortness of breath but denies any chest pain.  The history is provided by the patient.    History reviewed. No pertinent past medical history.  There are no active problems to display for this patient.   Past Surgical History:  Procedure Laterality Date  . TUBAL LIGATION      OB History    No data available       Home Medications    Prior to Admission medications   Medication Sig Start Date End Date Taking? Authorizing Provider  acetaminophen-codeine (TYLENOL #3) 300-30 MG tablet Take 1 tablet by mouth every 4 (four) hours as needed for moderate pain. 12/04/16 12/11/16 Yes Clent Demark, PA-C  celecoxib (CELEBREX) 200 MG capsule Take 1 capsule (200 mg total) by mouth 2 (two) times daily. 10/30/16  Yes Clent Demark, PA-C  esomeprazole (NEXIUM) 40 MG capsule Take 1 capsule (40 mg total) by mouth daily. 12/04/16  Yes Clent Demark, PA-C  ketotifen (ZADITOR) 0.025 % ophthalmic solution Place 1 drop into both eyes 2 (two) times daily. 10/02/16  Yes Clent Demark, PA-C  ondansetron [facility]) 4 MG tablet Take 1 tablet (4 mg total) by mouth every 8 (eight) hours as needed for nausea or vomiting. 10/30/16  Yes Clent Demark, PA-C  senna-docusate  (SENOKOT-S) 8.6-50 MG tablet Take 1 tablet by mouth daily as needed for mild constipation. 10/02/16  Yes Clent Demark, PA-C    Family History No family history on file.  Social History Social History  Substance Use Topics  . Smoking status: Never Smoker  . Smokeless tobacco: Never Used  . Alcohol use Yes     Comment: occ     Allergies   Patient has no known allergies.   Review of Systems Review of Systems  Gastrointestinal: Positive for abdominal pain.  All other systems reviewed and are negative.    Physical Exam Updated Vital Signs BP 121/72 (BP Location: Right Arm)   Pulse (!) 53   Temp 99.1 F (37.3 C) (Oral)   Resp (!) 22   Ht 6\' 2"  (1.88 m)   Wt 99.3 kg (219 lb)   LMP 11/24/2016 (Approximate)   SpO2 99%   BMI 28.12 kg/m   Physical Exam  Constitutional:  Slightly uncomfortable   HENT:  Head: Normocephalic.  Mouth/Throat: Oropharynx is clear and moist.  Eyes: Conjunctivae and EOM are normal. Pupils are equal, round, and reactive to light.  Neck: Normal range of motion. Neck supple.  Cardiovascular: Normal rate, regular rhythm and normal heart sounds.   Pulmonary/Chest: Effort normal and breath sounds normal. No respiratory distress. She has no wheezes. She has no rales.  Abdominal: Soft. Bowel sounds are normal.  + RUQ tenderness, ? Murphy sign,  no CVAT   Musculoskeletal:  Mild trazepius tenderness bilaterally, nl ROM R shoulder. No obvious bony tenderness   Neurological: She is alert.  Skin: Skin is warm.  Psychiatric: She has a normal mood and affect.  Nursing note and vitals reviewed.    ED Treatments / Results  Labs (all labs ordered are listed, but only abnormal results are displayed) Labs Reviewed  COMPREHENSIVE METABOLIC PANEL - Abnormal; Notable for the following:       Result Value   Glucose, Bld 102 (*)    Alkaline Phosphatase 31 (*)    All other components within normal limits  CBC - Abnormal; Notable for the following:     RBC 3.86 (*)    Hemoglobin 11.6 (*)    HCT 35.7 (*)    All other components within normal limits  URINALYSIS, ROUTINE W REFLEX MICROSCOPIC - Abnormal; Notable for the following:    Color, Urine STRAW (*)    Specific Gravity, Urine 1.004 (*)    Hgb urine dipstick SMALL (*)    Squamous Epithelial / LPF 0-5 (*)    All other components within normal limits  LIPASE, BLOOD  D-DIMER, QUANTITATIVE (NOT AT [facility])    EKG  EKG Interpretation  Date/Time:  Friday Dec 04 2016 18:22:30 EDT Ventricular Rate:  59 PR Interval:    QRS Duration: 105 QT Interval:  433 QTC Calculation: 429 R Axis:   -3 Text Interpretation:  Sinus rhythm Low voltage, precordial leads No previous ECGs available Confirmed by YAO  MD, DAVID ([id number]) on 12/04/2016 7:09:22 PM       Radiology US Abdomen Limited Ruq  Result Date: 12/04/2016 CLINICAL DATA:  46 y/o  F; right upper quadrant abdominal pain. EXAM: US ABDOMEN LIMITED - RIGHT UPPER QUADRANT COMPARISON:  05/28/2016 abdominal ultrasound. FINDINGS: Gallbladder: Gallstones measuring up to 8 mm. Positive sonographic Murphy's sign. No gallbladder wall thickening or pericholecystic fluid. Common bile duct: Diameter: 2.4 mm Liver: No focal lesion identified. Within normal limits in parenchymal echogenicity. IMPRESSION: Gallstones and positive sonographic Murphy sign. No gallbladder wall thickening or pericholecystic fluid. Findings indeterminate for acute cholecystitis. Electronically Signed   By: Kristine Garbe M.D.   On: 12/04/2016 20:13    Procedures Procedures (including critical care time)  Medications Ordered in ED Medications  morphine 4 MG/ML injection 4 mg (not administered)  sodium chloride 0.9 % bolus 1,000 mL (1,000 mLs Intravenous New Bag/Given 12/04/16 1816)  morphine 4 MG/ML injection 4 mg (4 mg Intravenous Given 12/04/16 1828)     Initial Impression / Assessment and Plan / ED Course  I have reviewed the triage vital signs and the nursing  notes.  Pertinent labs & imaging results that were available during my care of the patient were reviewed by me and considered in my medical decision making (see chart for details).     Haley Chandler is a 46 y.o. female here with RUQ pain, shoulder pain. Likely biliary colic. Will get labs, RUQ [place], UA. Will get d-dimer given shortness of breath. I think shoulder pain likely referred pain to RUQ.   8:47 PM D-dimer neg. US showed gallstones with positive sonographic murphy sign. Still having pain in the RUQ despite pain meds. Will consult surgery for early acute chole vs symptomatic chole.     Final Clinical Impressions(s) / ED Diagnoses   Final diagnoses:  RUQ pain  Calculus of gallbladder with acute cholecystitis without obstruction    New Prescriptions New Prescriptions   No medications on file  Drenda Freeze, MD 12/04/16 [phone]

## 2016-12-04 NOTE — H&P (Signed)
Haley Chandler is an 46 y.o. female.   Chief Complaint: abdominal pain HPI: Asked to see patient at the request of Dr Darl Householder of the ED for a 2 week history of right upper quadrant abdominal pain.  Patient has a history of right upper quadrant abdominal pain  After eating since November of 2017. S he was diagnosed with gallstones then.  No other symptoms until 2 weeks ago when she developed RUQ abdominal pain and right shoulder pain.  No SOB or chest pain.  This worsened today and her PCP sent her to the ED.  She was tender on exam in RUQ and U/S showee gallstones but no GB wall thickening.  She has pain after pain meds.   History reviewed. No pertinent past medical history.  Past Surgical History:  Procedure Laterality Date  . TUBAL LIGATION      No family history on file. Social History:  reports that she has never smoked. She has never used smokeless tobacco. She reports that she drinks alcohol. She reports that she does not use drugs.  Allergies: No Known Allergies   (Not in a hospital admission)  Results for orders placed or performed during the hospital encounter of 12/04/16 (from the past 48 hour(s))  Lipase, blood     Status: None   Collection Time: 12/04/16  2:02 PM  Result Value Ref Range   Lipase 41 11 - 51 U/L  Comprehensive metabolic panel     Status: Abnormal   Collection Time: 12/04/16  2:02 PM  Result Value Ref Range   Sodium 137 135 - 145 mmol/L   Potassium 3.6 3.5 - 5.1 mmol/L   Chloride 107 101 - 111 mmol/L   CO2 24 22 - 32 mmol/L   Glucose, Bld 102 (H) 65 - 99 mg/dL   BUN 13 6 - 20 mg/dL   Creatinine, Ser 0.94 0.44 - 1.00 mg/dL   Calcium 8.9 8.9 - 10.3 mg/dL   Total Protein 6.7 6.5 - 8.1 g/dL   Albumin 3.8 3.5 - 5.0 g/dL   AST 24 15 - 41 U/L   ALT 22 14 - 54 U/L   Alkaline Phosphatase 31 (L) 38 - 126 U/L   Total Bilirubin 0.6 0.3 - 1.2 mg/dL   GFR calc non Af Amer >60 >60 mL/min   GFR calc Af Amer >60 >60 mL/min    Comment: (NOTE) The eGFR has been calculated  using the CKD EPI equation. This calculation has not been validated in all clinical situations. eGFR's persistently <60 mL/min signify possible Chronic Kidney Disease.    Anion gap 6 5 - 15  CBC     Status: Abnormal   Collection Time: 12/04/16  2:02 PM  Result Value Ref Range   WBC 4.5 4.0 - 10.5 K/uL   RBC 3.86 (L) 3.87 - 5.11 MIL/uL   Hemoglobin 11.6 (L) 12.0 - 15.0 g/dL   HCT 35.7 (L) 36.0 - 46.0 %   MCV 92.5 78.0 - 100.0 fL   MCH 30.1 26.0 - 34.0 pg   MCHC 32.5 30.0 - 36.0 g/dL   RDW 12.9 11.5 - 15.5 %   Platelets 368 150 - 400 K/uL  Urinalysis, Routine w reflex microscopic     Status: Abnormal   Collection Time: 12/04/16  6:05 PM  Result Value Ref Range   Color, Urine STRAW (A) YELLOW   APPearance CLEAR CLEAR   Specific Gravity, Urine 1.004 (L) 1.005 - 1.030   pH 7.0 5.0 - 8.0  Glucose, UA NEGATIVE NEGATIVE mg/dL   Hgb urine dipstick SMALL (A) NEGATIVE   Bilirubin Urine NEGATIVE NEGATIVE   Ketones, ur NEGATIVE NEGATIVE mg/dL   Protein, ur NEGATIVE NEGATIVE mg/dL   Nitrite NEGATIVE NEGATIVE   Leukocytes, UA NEGATIVE NEGATIVE   RBC / HPF 0-5 0 - 5 RBC/hpf   WBC, UA NONE SEEN 0 - 5 WBC/hpf   Bacteria, UA NONE SEEN NONE SEEN   Squamous Epithelial / LPF 0-5 (A) NONE SEEN  D-dimer, quantitative (not at [facility])     Status: None   Collection Time: 12/04/16  6:22 PM  Result Value Ref Range   D-Dimer, Quant 0.38 0.00 - 0.50 ug/mL-FEU    Comment: (NOTE) At the manufacturer cut-off of 0.50 ug/mL FEU, this assay has been documented to exclude PE with a sensitivity and negative predictive value of 97 to 99%.  At this time, this assay has not been approved by the FDA to exclude DVT/VTE. Results should be correlated with clinical presentation.    US Abdomen Limited Ruq  Result Date: 12/04/2016 CLINICAL DATA:  46 y/o  F; right upper quadrant abdominal pain. EXAM: US ABDOMEN LIMITED - RIGHT UPPER QUADRANT COMPARISON:  05/28/2016 abdominal ultrasound. FINDINGS: Gallbladder:  Gallstones measuring up to 8 mm. Positive sonographic Murphy's sign. No gallbladder wall thickening or pericholecystic fluid. Common bile duct: Diameter: 2.4 mm Liver: No focal lesion identified. Within normal limits in parenchymal echogenicity. IMPRESSION: Gallstones and positive sonographic Murphy sign. No gallbladder wall thickening or pericholecystic fluid. Findings indeterminate for acute cholecystitis. Electronically Signed   By: Kristine Garbe M.D.   On: 12/04/2016 20:13    Review of Systems  Constitutional: Positive for malaise/fatigue. Negative for diaphoresis.  HENT: Positive for tinnitus. Negative for hearing loss.   Eyes: Negative for blurred vision and double vision.  Respiratory: Negative for cough and hemoptysis.   Cardiovascular: Positive for leg swelling. Negative for chest pain and palpitations.  Gastrointestinal: Positive for abdominal pain. Negative for vomiting.  Genitourinary: Negative for dysuria and urgency.  Musculoskeletal: Negative for myalgias and neck pain.  Skin: Negative for rash.  Neurological: Positive for dizziness. Negative for headaches.  Psychiatric/Behavioral: Negative for depression and suicidal ideas.    Blood pressure 121/72, pulse (!) 53, temperature 99.1 F (37.3 C), temperature source Oral, resp. rate (!) 22, height '6\' 2"'  (1.88 m), weight 99.3 kg (219 lb), last menstrual period 11/24/2016, SpO2 99 %. Physical Exam  Constitutional: She is oriented to person, place, and time. She appears well-developed and well-nourished.  HENT:  Head: Normocephalic and atraumatic.  Mouth/Throat: No oropharyngeal exudate.  Eyes: EOM are normal. Pupils are equal, round, and reactive to light. No scleral icterus.  Neck: Normal range of motion. Neck supple.  Cardiovascular: Normal rate and regular rhythm.   Respiratory: Effort normal and breath sounds normal. No respiratory distress.  GI: There is tenderness in the right upper quadrant. There is positive  Murphy's sign.  Musculoskeletal: Normal range of motion.  Neurological: She is alert and oriented to person, place, and time.  Skin: Skin is warm and dry.  Psychiatric: She has a normal mood and affect. Her behavior is normal. Thought content normal.     Assessment/Plan Acute cholecystitis  Admit for IVF pain control and LAP CHOLECYSTECTOMY IN AM BY Dr Hulen Skains. The procedure has been discussed with the patient. Operative and non operative treatments have been discussed. Risks of surgery include bleeding, infection,  Common bile duct injury,  Injury to the stomach,liver, colon,small intestine, abdominal wall,  Diaphragm,  Major blood vessels,  And the need for an open procedure.  Other risks include worsening of medical problems, death,  DVT and pulmonary embolism, and cardiovascular events.   Medical options have also been discussed. The patient has been informed of long term expectations of surgery and non surgical options,  The patient agrees to proceed.    Aleynah Rocchio A., MD 12/04/2016, 8:55 PM

## 2016-12-04 NOTE — ED Notes (Signed)
Pt given sprite 

## 2016-12-04 NOTE — ED Notes (Signed)
Patient transported to Ultrasound 

## 2016-12-04 NOTE — ED Notes (Signed)
Attempted report x1. 

## 2016-12-05 ENCOUNTER — Encounter (HOSPITAL_COMMUNITY)
Admission: EM | Disposition: A | Discharge status: Home / Self Care | Payer: Self-pay | Source: Home / Self Care | Attending: Emergency Medicine

## 2016-12-05 ENCOUNTER — Inpatient Hospital Stay (HOSPITAL_COMMUNITY): Payer: Self-pay | Admitting: Anesthesiology

## 2016-12-05 ENCOUNTER — Encounter (HOSPITAL_COMMUNITY): Payer: Self-pay | Admitting: *Deleted

## 2016-12-05 DIAGNOSIS — K8020 Calculus of gallbladder without cholecystitis without obstruction: Secondary | ICD-10-CM | POA: Diagnosis present

## 2016-12-05 HISTORY — PX: CHOLECYSTECTOMY: SHX55

## 2016-12-05 LAB — SURGICAL PCR SCREEN
MRSA, PCR: NEGATIVE
STAPHYLOCOCCUS AUREUS: NEGATIVE

## 2016-12-05 LAB — HIV ANTIBODY (ROUTINE TESTING W REFLEX): HIV SCREEN 4TH GENERATION: NONREACTIVE

## 2016-12-05 SURGERY — LAPAROSCOPIC CHOLECYSTECTOMY WITH INTRAOPERATIVE CHOLANGIOGRAM
Anesthesia: General | Site: Abdomen

## 2016-12-05 MED ORDER — MIDAZOLAM HCL 5 MG/5ML IJ SOLN
INTRAMUSCULAR | Status: DC | PRN
  Administered 2016-12-05 (×2): 1 mg via INTRAVENOUS

## 2016-12-05 MED ORDER — SODIUM CHLORIDE 0.9 % IR SOLN
Status: DC | PRN
  Administered 2016-12-05: 1000 mL

## 2016-12-05 MED ORDER — PROPOFOL 10 MG/ML IV BOLUS
INTRAVENOUS | Status: AC
  Filled 2016-12-05: qty 20

## 2016-12-05 MED ORDER — PHENYLEPHRINE HCL 10 MG/ML IJ SOLN
INTRAMUSCULAR | Status: DC | PRN
  Administered 2016-12-05 (×3): 40 ug via INTRAVENOUS

## 2016-12-05 MED ORDER — MIDAZOLAM HCL 2 MG/2ML IJ SOLN
INTRAMUSCULAR | Status: AC
  Filled 2016-12-05: qty 2

## 2016-12-05 MED ORDER — ONDANSETRON 4 MG PO TBDP: 4.0000 mg | ORAL_TABLET | Freq: Four times a day (QID) | ORAL | Status: DC | PRN

## 2016-12-05 MED ORDER — ROCURONIUM BROMIDE 100 MG/10ML IV SOLN
INTRAVENOUS | Status: DC | PRN
  Administered 2016-12-05: 10 mg via INTRAVENOUS
  Administered 2016-12-05: 50 mg via INTRAVENOUS

## 2016-12-05 MED ORDER — BUPIVACAINE-EPINEPHRINE 0.25% -1:200000 IJ SOLN
INTRAMUSCULAR | Status: DC | PRN
  Administered 2016-12-05: 10 mL

## 2016-12-05 MED ORDER — FENTANYL CITRATE (PF) 100 MCG/2ML IJ SOLN
INTRAMUSCULAR | Status: DC | PRN
  Administered 2016-12-05: 50 ug via INTRAVENOUS
  Administered 2016-12-05: 150 ug via INTRAVENOUS
  Administered 2016-12-05: 50 ug via INTRAVENOUS

## 2016-12-05 MED ORDER — SUCCINYLCHOLINE CHLORIDE 200 MG/10ML IV SOSY
PREFILLED_SYRINGE | INTRAVENOUS | Status: AC
  Filled 2016-12-05: qty 10

## 2016-12-05 MED ORDER — HYDROMORPHONE HCL 1 MG/ML IJ SOLN: 0.5000 mg | INTRAMUSCULAR | Status: DC | PRN

## 2016-12-05 MED ORDER — 0.9 % SODIUM CHLORIDE (POUR BTL) OPTIME
TOPICAL | Status: DC | PRN
  Administered 2016-12-05: 1000 mL

## 2016-12-05 MED ORDER — METHOCARBAMOL 500 MG PO TABS
500.0000 mg | ORAL_TABLET | Freq: Four times a day (QID) | ORAL | Status: DC | PRN
  Administered 2016-12-05: 500 mg via ORAL
  Filled 2016-12-05: qty 1

## 2016-12-05 MED ORDER — FENTANYL CITRATE (PF) 250 MCG/5ML IJ SOLN
INTRAMUSCULAR | Status: AC
  Filled 2016-12-05: qty 5

## 2016-12-05 MED ORDER — HYDROMORPHONE HCL 1 MG/ML IJ SOLN
0.2500 mg | INTRAMUSCULAR | Status: DC | PRN
  Administered 2016-12-05 (×4): 0.5 mg via INTRAVENOUS

## 2016-12-05 MED ORDER — KCL IN DEXTROSE-NACL 20-5-0.9 MEQ/L-%-% IV SOLN
INTRAVENOUS | Status: DC
  Administered 2016-12-05: 01:00:00 via INTRAVENOUS
  Filled 2016-12-05 (×2): qty 1000

## 2016-12-05 MED ORDER — LIDOCAINE 2% (20 MG/ML) 5 ML SYRINGE
INTRAMUSCULAR | Status: AC
  Filled 2016-12-05: qty 5

## 2016-12-05 MED ORDER — MEPERIDINE HCL 25 MG/ML IJ SOLN: 6.2500 mg | INTRAMUSCULAR | Status: DC | PRN

## 2016-12-05 MED ORDER — HYDROMORPHONE HCL 1 MG/ML IJ SOLN: 1.0000 mg | INTRAMUSCULAR | Status: DC | PRN

## 2016-12-05 MED ORDER — ONDANSETRON HCL 4 MG/2ML IJ SOLN
INTRAMUSCULAR | Status: AC
  Filled 2016-12-05: qty 2

## 2016-12-05 MED ORDER — PROPOFOL 10 MG/ML IV BOLUS
INTRAVENOUS | Status: DC | PRN
  Administered 2016-12-05: 120 mg via INTRAVENOUS
  Administered 2016-12-05: 30 mg via INTRAVENOUS

## 2016-12-05 MED ORDER — LACTATED RINGERS IV SOLN
INTRAVENOUS | Status: DC | PRN
  Administered 2016-12-05: 07:00:00 via INTRAVENOUS

## 2016-12-05 MED ORDER — ENOXAPARIN SODIUM 40 MG/0.4ML ~~LOC~~ SOLN: 40.0000 mg | Freq: Every day | SUBCUTANEOUS | Status: DC

## 2016-12-05 MED ORDER — KETOTIFEN FUMARATE 0.025 % OP SOLN
1.0000 [drp] | Freq: Two times a day (BID) | OPHTHALMIC | Status: DC
  Administered 2016-12-05 – 2016-12-06 (×3): 1 [drp] via OPHTHALMIC
  Filled 2016-12-05: qty 5

## 2016-12-05 MED ORDER — OXYCODONE HCL 5 MG PO TABS
5.0000 mg | ORAL_TABLET | ORAL | Status: DC | PRN
  Administered 2016-12-05: 5 mg via ORAL
  Filled 2016-12-05: qty 1

## 2016-12-05 MED ORDER — MUPIROCIN 2 % EX OINT
TOPICAL_OINTMENT | Freq: Two times a day (BID) | CUTANEOUS | Status: DC
  Administered 2016-12-05: 06:00:00 via NASAL
  Filled 2016-12-05: qty 22

## 2016-12-05 MED ORDER — HYDROMORPHONE HCL 1 MG/ML IJ SOLN
INTRAMUSCULAR | Status: AC
  Administered 2016-12-05: 0.5 mg via INTRAVENOUS
  Filled 2016-12-05: qty 1

## 2016-12-05 MED ORDER — LIDOCAINE HCL (CARDIAC) 20 MG/ML IV SOLN
INTRAVENOUS | Status: DC | PRN
  Administered 2016-12-05: 100 mg via INTRAVENOUS

## 2016-12-05 MED ORDER — DEXTROSE 5 % IV SOLN
2.0000 g | Freq: Every day | INTRAVENOUS | Status: DC
Start: 2016-12-05 — End: 2016-12-05
  Administered 2016-12-05: 2 g via INTRAVENOUS
  Filled 2016-12-05: qty 2

## 2016-12-05 MED ORDER — KCL IN DEXTROSE-NACL 20-5-0.45 MEQ/L-%-% IV SOLN
INTRAVENOUS | Status: DC
  Administered 2016-12-05: 11:00:00 via INTRAVENOUS
  Filled 2016-12-05: qty 1000

## 2016-12-05 MED ORDER — SUGAMMADEX SODIUM 200 MG/2ML IV SOLN
INTRAVENOUS | Status: DC | PRN
  Administered 2016-12-05: 200 mg via INTRAVENOUS

## 2016-12-05 MED ORDER — ENOXAPARIN SODIUM 40 MG/0.4ML ~~LOC~~ SOLN
40.0000 mg | SUBCUTANEOUS | Status: DC
  Administered 2016-12-06: 40 mg via SUBCUTANEOUS
  Filled 2016-12-05: qty 0.4

## 2016-12-05 MED ORDER — BUPIVACAINE HCL (PF) 0.25 % IJ SOLN
INTRAMUSCULAR | Status: AC
  Filled 2016-12-05: qty 30

## 2016-12-05 MED ORDER — ACETAMINOPHEN 500 MG PO TABS
1000.0000 mg | ORAL_TABLET | Freq: Four times a day (QID) | ORAL | Status: DC
  Administered 2016-12-05 – 2016-12-06 (×4): 1000 mg via ORAL
  Filled 2016-12-05 (×4): qty 2

## 2016-12-05 MED ORDER — ROCURONIUM BROMIDE 10 MG/ML (PF) SYRINGE
PREFILLED_SYRINGE | INTRAVENOUS | Status: AC
  Filled 2016-12-05: qty 5

## 2016-12-05 MED ORDER — ONDANSETRON HCL 4 MG/2ML IJ SOLN
4.0000 mg | Freq: Four times a day (QID) | INTRAMUSCULAR | Status: DC | PRN
  Administered 2016-12-05: 4 mg via INTRAVENOUS

## 2016-12-05 MED ORDER — IOPAMIDOL (ISOVUE-300) INJECTION 61%
INTRAVENOUS | Status: AC
  Filled 2016-12-05: qty 50

## 2016-12-05 MED ORDER — POLYETHYLENE GLYCOL 3350 17 G PO PACK: 17.0000 g | PACK | Freq: Every day | ORAL | Status: DC | PRN

## 2016-12-05 MED ORDER — ONDANSETRON HCL 4 MG/2ML IJ SOLN: 4.0000 mg | Freq: Once | INTRAMUSCULAR | Status: DC | PRN

## 2016-12-05 MED ORDER — PHENYLEPHRINE 40 MCG/ML (10ML) SYRINGE FOR IV PUSH (FOR BLOOD PRESSURE SUPPORT)
PREFILLED_SYRINGE | INTRAVENOUS | Status: AC
  Filled 2016-12-05: qty 10

## 2016-12-05 MED ORDER — SUGAMMADEX SODIUM 200 MG/2ML IV SOLN
INTRAVENOUS | Status: AC
  Filled 2016-12-05: qty 2

## 2016-12-05 MED ORDER — ONDANSETRON HCL 4 MG/2ML IJ SOLN: 4.0000 mg | Freq: Four times a day (QID) | INTRAMUSCULAR | Status: DC | PRN

## 2016-12-05 SURGICAL SUPPLY — 42 items
APPLIER CLIP 5 13 M/L LIGAMAX5 (MISCELLANEOUS) ×3
BLADE CLIPPER SURG (BLADE) IMPLANT
CANISTER SUCT 3000ML PPV (MISCELLANEOUS) ×3 IMPLANT
CHLORAPREP W/TINT 26ML (MISCELLANEOUS) ×3 IMPLANT
CLIP APPLIE 5 13 M/L LIGAMAX5 (MISCELLANEOUS) ×1 IMPLANT
CLOSURE WOUND 1/2 X4 (GAUZE/BANDAGES/DRESSINGS) ×1
COVER MAYO STAND STRL (DRAPES) IMPLANT
COVER SURGICAL LIGHT HANDLE (MISCELLANEOUS) ×3 IMPLANT
DERMABOND ADVANCED (GAUZE/BANDAGES/DRESSINGS) ×2
DERMABOND ADVANCED .7 DNX12 (GAUZE/BANDAGES/DRESSINGS) ×1 IMPLANT
DRAPE C-ARM 42X72 X-RAY (DRAPES) IMPLANT
DRSG TEGADERM 2-3/8X2-3/4 SM (GAUZE/BANDAGES/DRESSINGS) ×12 IMPLANT
DRSG TEGADERM 4X4.75 (GAUZE/BANDAGES/DRESSINGS) ×3 IMPLANT
ELECT REM PT RETURN 9FT ADLT (ELECTROSURGICAL) ×3
ELECTRODE REM PT RTRN 9FT ADLT (ELECTROSURGICAL) ×1 IMPLANT
GLOVE BIOGEL PI IND STRL 8 (GLOVE) ×1 IMPLANT
GLOVE BIOGEL PI INDICATOR 8 (GLOVE) ×2
GLOVE ECLIPSE 7.5 STRL STRAW (GLOVE) ×3 IMPLANT
GOWN STRL REUS W/ TWL LRG LVL3 (GOWN DISPOSABLE) ×3 IMPLANT
GOWN STRL REUS W/TWL LRG LVL3 (GOWN DISPOSABLE) ×6
KIT BASIN OR (CUSTOM PROCEDURE TRAY) ×3 IMPLANT
KIT ROOM TURNOVER OR (KITS) ×3 IMPLANT
L-HOOK LAP DISP 36CM (ELECTROSURGICAL) ×3
LHOOK LAP DISP 36CM (ELECTROSURGICAL) ×1 IMPLANT
NS IRRIG 1000ML POUR BTL (IV SOLUTION) ×3 IMPLANT
PAD ARMBOARD 7.5X6 YLW CONV (MISCELLANEOUS) ×3 IMPLANT
PENCIL BUTTON HOLSTER BLD 10FT (ELECTRODE) ×3 IMPLANT
POUCH RETRIEVAL ECOSAC 10 (ENDOMECHANICALS) ×1 IMPLANT
POUCH RETRIEVAL ECOSAC 10MM (ENDOMECHANICALS) ×2
SCISSORS LAP 5X35 DISP (ENDOMECHANICALS) ×3 IMPLANT
SET CHOLANGIOGRAPH 5 50 .035 (SET/KITS/TRAYS/PACK) IMPLANT
SET IRRIG TUBING LAPAROSCOPIC (IRRIGATION / IRRIGATOR) ×3 IMPLANT
SLEEVE ENDOPATH XCEL 5M (ENDOMECHANICALS) ×6 IMPLANT
SPECIMEN JAR SMALL (MISCELLANEOUS) ×3 IMPLANT
STRIP CLOSURE SKIN 1/2X4 (GAUZE/BANDAGES/DRESSINGS) ×2 IMPLANT
SUT MNCRL AB 4-0 PS2 18 (SUTURE) ×3 IMPLANT
TOWEL OR 17X24 6PK STRL BLUE (TOWEL DISPOSABLE) ×3 IMPLANT
TOWEL OR 17X26 10 PK STRL BLUE (TOWEL DISPOSABLE) ×3 IMPLANT
TRAY LAPAROSCOPIC MC (CUSTOM PROCEDURE TRAY) ×3 IMPLANT
TROCAR XCEL BLUNT TIP 100MML (ENDOMECHANICALS) ×3 IMPLANT
TROCAR XCEL NON-BLD 5MMX100MML (ENDOMECHANICALS) ×3 IMPLANT
TUBING INSUFFLATION (TUBING) ×3 IMPLANT

## 2016-12-05 NOTE — Anesthesia Preprocedure Evaluation (Signed)
Anesthesia Evaluation  Patient identified by MRN, date of birth, ID band Patient awake    Reviewed: Allergy & Precautions, NPO status , Patient's Chart, lab work & pertinent test results  Airway Mallampati: I  TM Distance: >3 FB Neck ROM: Full    Dental   Pulmonary    Pulmonary exam normal        Cardiovascular Normal cardiovascular exam     Neuro/Psych    GI/Hepatic   Endo/Other    Renal/GU      Musculoskeletal   Abdominal   Peds  Hematology   Anesthesia Other Findings   Reproductive/Obstetrics                             Anesthesia Physical Anesthesia Plan  ASA: II  Anesthesia Plan: General   Post-op Pain Management:    Induction: Intravenous  Airway Management Planned: Oral ETT  Additional Equipment:   Intra-op Plan:   Post-operative Plan: Extubation in OR  Informed Consent: I have reviewed the patients History and Physical, chart, labs and discussed the procedure including the risks, benefits and alternatives for the proposed anesthesia with the patient or authorized representative who has indicated his/her understanding and acceptance.     Plan Discussed with: Surgeon and CRNA  Anesthesia Plan Comments:         Anesthesia Quick Evaluation

## 2016-12-05 NOTE — Transfer of Care (Signed)
Immediate Anesthesia Transfer of Care Note  Patient: Haley Chandler  Procedure(s) Performed: Procedure(s): LAPAROSCOPIC CHOLECYSTECTOMY WITH INTRAOPERATIVE CHOLANGIOGRAM (N/A)  Patient Location: PACU  Anesthesia Type:General  Level of Consciousness: awake, oriented and drowsy  Airway & Oxygen Therapy: Patient Spontanous Breathing and Patient connected to nasal cannula oxygen  Post-op Assessment: Report given to RN, Post -op Vital signs reviewed and stable and Patient moving all extremities  Post vital signs: Reviewed and stable  Last Vitals:  Vitals:   12/04/16 2358 12/05/16 0447  BP: 112/68 104/61  Pulse: (!) 58 (!) 54  Resp: 18 18  Temp: 36.5 C 36.6 C    Last Pain:  Vitals:   12/05/16 0000  TempSrc:   PainSc: 0-No pain         Complications: No apparent anesthesia complications

## 2016-12-05 NOTE — Progress Notes (Signed)
Received pt from PACU, A&O x4. Lap sites to abd with dressing dry and intact, active bowel sounds.

## 2016-12-05 NOTE — Anesthesia Procedure Notes (Signed)
Procedure Name: Intubation Date/Time: 12/05/2016 7:33 AM Performed by: Izora Gala Pre-anesthesia Checklist: Patient identified, Emergency Drugs available, Suction available and Patient being monitored Patient Re-evaluated:Patient Re-evaluated prior to inductionOxygen Delivery Method: Circle system utilized and Simple face mask Preoxygenation: Pre-oxygenation with 100% oxygen Intubation Type: IV induction Ventilation: Mask ventilation without difficulty Laryngoscope Size: Miller and 3 Grade View: Grade II Tube type: Oral Tube size: 7.0 mm Number of attempts: 1 Airway Equipment and Method: Stylet Placement Confirmation: ETT inserted through vocal cords under direct vision,  positive ETCO2 and breath sounds checked- equal and bilateral Secured at: 22 cm Tube secured with: Tape Dental Injury: Teeth and Oropharynx as per pre-operative assessment

## 2016-12-05 NOTE — Anesthesia Postprocedure Evaluation (Signed)
Anesthesia Post Note  Patient: Haley Chandler  Procedure(s) Performed: Procedure(s) (LRB): LAPAROSCOPIC CHOLECYSTECTOMY WITH INTRAOPERATIVE CHOLANGIOGRAM (N/A)  Patient location during evaluation: PACU Anesthesia Type: General Level of consciousness: awake and alert Pain management: pain level controlled Vital Signs Assessment: post-procedure vital signs reviewed and stable Respiratory status: spontaneous breathing, nonlabored ventilation, respiratory function stable and patient connected to nasal cannula oxygen Cardiovascular status: blood pressure returned to baseline and stable Postop Assessment: no signs of nausea or vomiting Anesthetic complications: no       Last Vitals:  Vitals:   12/05/16 0930 12/05/16 0944  BP:  105/60  Pulse: 65 67  Resp: 10 11  Temp: 36.2 C     Last Pain:  Vitals:   12/05/16 0000  TempSrc:   PainSc: 0-No pain                 Taysean Wager DAVID

## 2016-12-05 NOTE — Op Note (Signed)
OPERATIVE REPORT  DATE OF OPERATION:  12/05/2016  PATIENT:  Haley Chandler  46 y.o. female  PRE-OPERATIVE DIAGNOSIS:  CHOLECYSTITIS/CHOLELITHIASIS  POST-OPERATIVE DIAGNOSIS:  CHOLECYSTITIS/CHOLELITHIASIS  INDICATION(S) FOR OPERATION:  Symptomatic cholelithiasis  FINDINGS:  Scar tissue at the infundibulum, normal anatomy with the exception of the cystic artery coming off the right hepatic laterally in the triangle of Chalot  PROCEDURE:  Procedure(s): LAPAROSCOPIC CHOLECYSTECTOMY   SURGEON:  Surgeon(s): Judeth Horn, MD  ASSISTANT: None  ANESTHESIA:   local and general  COMPLICATIONS:  None  EBL: <20 ml  BLOOD ADMINISTERED: none  DRAINS: none   SPECIMEN:  Source of Specimen:  Gallbladder and contents  COUNTS CORRECT:  YES  PROCEDURE DETAILS: The patient was taken to the operating room and placed on the table in the supine position.  After an adequate endotracheal anesthetic was administered, the patient was prepped with ChloroPrep, and then draped in the usual manner exposing the entire abdomen laterally, inferiorly and up  to the costal margins.  After a proper timeout was performed including identifying the patient and the procedure to be performed, a supraumbilical 5.3ZJ midline incision was made using a #15 blade.  This was taken down to the fascia which was then incised with a #15 blade.  The edges of the fascia were tented up with Kocher clamps as the preperitoneal space was penetrated with a Kelly clamp into the peritoneum.  Once this was done, a pursestring suture of 0 Vicryl was passed around the fascial opening.  This was subsequently used to secure the [facility] cannula which was passed into the peritoneal cavity.  Once the [facility] cannula was in place, carbon dioxide gas was insufflated into the peritoneal cavity up to a maximal intra-abdominal pressure of 28mm Hg.The laparoscope, with attached camera and light source, was passed into the peritoneal cavity to visualize the  direct insertion of two right upper quadrant 47mm cannulas, and a sup-xiphoid 21mm cannula.  Once all cannulas were in place, the dissection was begun.  Two ratcheted graspers were attached to the dome and infundibulum of the gallbladder and retracted towards the anterior abdominal wall and the right upper quadrant.  Using cautery attached to a dissecting forceps, the peritoneum overlaying the triangle of Chalot and the hepatoduodenal triangle was dissected away exposing the cystic duct and the cystic artery.  A critical window was developed between the CBD and the cystic duct The cystic artery was clipped proximally and distally then transected.  A clip was placed on the gallbladder side of the cystic duct, then  the distal cystic duct was clipped three times then transected between the clips.  The gallbladder was then dissected out of the hepatic bed without event.  It was retrieved from the abdomen using an EcoPouch bag without event.  Once the gallbladder was removed, the bed was inspected for hemostasis.  Once excellent hemostasis was obtained all gas and fluids were aspirated from above the liver, then the cannulas were removed.  The supraumbilical incision was closed using the pursestring suture which was in place.  0.25% bupivicaine without epinephrine was injected at all sites.  All 31mm or greater cannula sites were close using a running subcuticular stitch of 4-0 Monocryl.  5.70mm cannula sites were closed with Dermabond only.Steri-Strips and Tagaderm were used to complete the dressings at all sites.  At this point all needle, sponge, and instrument counts were correct.The patient was awakened from anesthesia and taken to the PACU in stable condition.  Kathryne Eriksson. Dahlia Bailiff, MD,  FACS [phone] [facility]  PATIENT DISPOSITION:  PACU - hemodynamically stable.   Tom Ragsdale 5/26/20188:55 AM

## 2016-12-06 ENCOUNTER — Encounter (HOSPITAL_COMMUNITY): Payer: Self-pay | Admitting: General Surgery

## 2016-12-06 LAB — COMPREHENSIVE METABOLIC PANEL
ALK PHOS: 27 U/L — AB (ref 38–126)
ALT: 38 U/L (ref 14–54)
AST: 32 U/L (ref 15–41)
Albumin: 3.1 g/dL — ABNORMAL LOW (ref 3.5–5.0)
Anion gap: 5 (ref 5–15)
BILIRUBIN TOTAL: 1 mg/dL (ref 0.3–1.2)
BUN: 6 mg/dL (ref 6–20)
CALCIUM: 8.6 mg/dL — AB (ref 8.9–10.3)
CO2: 25 mmol/L (ref 22–32)
CREATININE: 0.89 mg/dL (ref 0.44–1.00)
Chloride: 108 mmol/L (ref 101–111)
GFR calc Af Amer: >60 mL/min (ref >60)
GLUCOSE: 98 mg/dL (ref 65–99)
Potassium: 3.9 mmol/L (ref 3.5–5.1)
Sodium: 138 mmol/L (ref 135–145)
TOTAL PROTEIN: 5.8 g/dL — AB (ref 6.5–8.1)

## 2016-12-06 LAB — CBC
HCT: 33.9 % — ABNORMAL LOW (ref 36.0–46.0)
Hemoglobin: 10.9 g/dL — ABNORMAL LOW (ref 12.0–15.0)
MCH: 29.9 pg (ref 26.0–34.0)
MCHC: 32.2 g/dL (ref 30.0–36.0)
MCV: 93.1 fL (ref 78.0–100.0)
PLATELETS: 307 10*3/uL (ref 150–400)
RBC: 3.64 MIL/uL — ABNORMAL LOW (ref 3.87–5.11)
RDW: 13.1 % (ref 11.5–15.5)
WBC: 4.9 10*3/uL (ref 4.0–10.5)

## 2016-12-06 MED ORDER — OXYCODONE HCL 5 MG PO TABS: 5.0000 mg | ORAL_TABLET | ORAL | 0 refills | Status: DC | PRN

## 2016-12-06 MED ORDER — SIMETHICONE 80 MG PO CHEW: 80.0000 mg | CHEWABLE_TABLET | Freq: Four times a day (QID) | ORAL | Status: DC | PRN

## 2016-12-06 NOTE — Progress Notes (Signed)
Pt is feeling a lot better. Tolerating regular diet. Lap sites to abd with tegaderm dressing dry and intact. Denies pain. Discharge instructions given to pt, verbalized understanding. Discharged to home accompanied by her sister.

## 2016-12-06 NOTE — Discharge Summary (Signed)
Physician Discharge Summary [facility]  Patient ID: Haley Chandler MRN: [id number] DOB/AGE: [birth year] 46 y.o.  Admit date: 12/04/2016 Discharge date: 12/06/2016  Admission Diagnoses:  Chronic cholecystitis, cholelithiasis  Discharge Diagnoses:  Active Problems:   Acute cholecystitis   Symptomatic cholelithiasis   Discharged Condition: good  Hospital Course: Patient was admitted for observation following gallbladder surgery.  Post op course was uncomplicated.  Pain was well controlled.  Tolerated diet.  Patient was prepared for discharge home on POD#1.  Consults: None  Treatments: surgery: lap chole per Dr. Hulen Skains  Discharge Exam: Blood pressure 121/67, pulse (!) 58, temperature 98.1 F (36.7 C), temperature source Oral, resp. rate 18, height 6\' 2"  (1.88 m), weight 97.9 kg (215 lb 13.3 oz), last menstrual period 11/24/2016, SpO2 100 %. HEENT - clear Neck - soft Chest - clear bilaterally Cor - RRR Abd - soft without distension; wounds dry and intact  Disposition: Home  Discharge Instructions    Diet - low sodium heart healthy    Complete by:  As directed    Discharge instructions    Complete by:  As directed    [facility]  LAPAROSCOPIC SURGERY:  POST-OP INSTRUCTIONS  Always review your discharge instruction sheet given to you by the facility where your surgery was performed.  A prescription for pain medication may be given to you upon discharge.  Take your pain medication as prescribed.  If narcotic pain medicine is not needed, then you may take acetaminophen (Tylenol) or ibuprofen (Advil) as needed.  Take your usually prescribed medications unless otherwise directed.  If you need a refill on your pain medication, please contact your pharmacy.  They will contact our office to request authorization. Prescriptions will not be filled after 5 P.M. or on weekends.  You should follow a light diet the first few days after arrival home, such  as soup and crackers or toast.  Be sure to include plenty of fluids daily.  Most patients will experience some swelling and bruising in the area of the incisions.  Ice packs will help.  Swelling and bruising can take several days to resolve.   It is common to experience some constipation after surgery.  Increasing fluid intake and taking a stool softener (such as Colace) will usually help or prevent this problem from occurring.  A mild laxative (Milk of Magnesia or Miralax) should be taken according to package instructions if there has been no bowel movement after 48 hours.  You will have steri-strips and a gauze dressing over your incisions.  You may remove the gauze bandage on the second day after surgery, and you may shower at that time.  Leave your steri-strips (small skin tapes) in place directly over the incision.  These strips should remain on the skin for 5-7 days and then be removed.  You may get them wet in the shower and pat them dry.  Any sutures or staples will be removed at the office during your follow-up visit.  ACTIVITIES:  You may resume regular (light) daily activities beginning the next day - such as daily self-care, walking, climbing stairs - gradually increasing activities as tolerated.  You may have sexual intercourse when it is comfortable.  Refrain from any heavy lifting or straining until approved by your doctor.  You may drive when you are no longer taking prescription pain medication, you can comfortably wear a seatbelt, and you can safely maneuver your car and apply brakes.  You should see your doctor  in the office for a follow-up appointment approximately 2-3 weeks after your surgery.  Make sure that you call for this appointment within a day or two after you arrive home to insure a convenient appointment time.  WHEN TO CALL YOUR DOCTOR: Fever over 101.0 Inability to urinate Continued bleeding from incision Increased pain, redness, or drainage from the  incision Increasing abdominal pain  The clinic staff is available to answer your questions during regular business hours.  Please don't hesitate to call and ask to speak to one of the nurses for clinical concerns.  If you have a medical emergency, go to the nearest emergency room or call 911.  A surgeon from [facility] is always on call for the hospital.  Earnstine Regal, MD, [facility] Office: [place] Free:  [place] [phone]  Website: [email or web address]   Increase activity slowly    Complete by:  As directed    No dressing needed    Complete by:  As directed      Allergies as of 12/06/2016   No Known Allergies     Medication List    TAKE these medications   acetaminophen-codeine 300-30 MG tablet Commonly known as:  TYLENOL #3 Take 1 tablet by mouth every 4 (four) hours as needed for moderate pain.   celecoxib 200 MG capsule Commonly known as:  CELEBREX Take 1 capsule (200 mg total) by mouth 2 (two) times daily.   esomeprazole 40 MG capsule Commonly known as:  NEXIUM Take 1 capsule (40 mg total) by mouth daily.   ketotifen 0.025 % ophthalmic solution Commonly known as:  ZADITOR Place 1 drop into both eyes 2 (two) times daily.   ondansetron 4 MG tablet Commonly known as:  ZOFRAN Take 1 tablet (4 mg total) by mouth every 8 (eight) hours as needed for nausea or vomiting.   oxyCODONE 5 MG immediate release tablet Commonly known as:  Oxy IR/ROXICODONE Take 1-2 tablets (5-10 mg total) by mouth every 4 (four) hours as needed for moderate pain.   senna-docusate 8.6-50 MG tablet Commonly known as:  Senokot-S Take 1 tablet by mouth daily as needed for mild constipation.        Earnstine Regal, MD, [facility] Office: [phone]   Signed: Earnstine Regal 12/06/2016, 7:51 AM

## 2016-12-06 NOTE — Progress Notes (Addendum)
Patient OOB ambulatory in hall; 3 laps, tolerated well.

## 2016-12-06 NOTE — Progress Notes (Signed)
Patient OOB ambulatory in hall; 2 laps, tolerated well.

## 2016-12-16 MED FILL — ?ESOMEPRAZOLE MAG DR 40 MG: 40 MG | 30 days supply | Qty: 30 | Fill #0

## 2016-12-18 ENCOUNTER — Ambulatory Visit (INDEPENDENT_AMBULATORY_CARE_PROVIDER_SITE_OTHER): Payer: Self-pay

## 2016-12-24 ENCOUNTER — Encounter (INDEPENDENT_AMBULATORY_CARE_PROVIDER_SITE_OTHER): Payer: Self-pay | Admitting: Physician Assistant

## 2016-12-24 ENCOUNTER — Ambulatory Visit (INDEPENDENT_AMBULATORY_CARE_PROVIDER_SITE_OTHER): Payer: Self-pay | Admitting: Physician Assistant

## 2016-12-24 VITALS — BP 114/74 | HR 55 | Temp 97.9°F | Wt 217.0 lb

## 2016-12-24 DIAGNOSIS — R634 Abnormal weight loss: Secondary | ICD-10-CM

## 2016-12-24 DIAGNOSIS — K2100 Gastro-esophageal reflux disease with esophagitis, without bleeding: Secondary | ICD-10-CM

## 2016-12-24 DIAGNOSIS — R0982 Postnasal drip: Secondary | ICD-10-CM

## 2016-12-24 DIAGNOSIS — Z9049 Acquired absence of other specified parts of digestive tract: Secondary | ICD-10-CM

## 2016-12-24 MED ORDER — DIPHENHYDRAMINE HCL 25 MG PO TABS: 25.0000 mg | ORAL_TABLET | Freq: Four times a day (QID) | ORAL | 0 refills | Status: DC | PRN

## 2016-12-24 MED ORDER — ESOMEPRAZOLE MAGNESIUM 40 MG PO CPDR: 40.0000 mg | DELAYED_RELEASE_CAPSULE | Freq: Every day | ORAL | 3 refills | Status: DC

## 2016-12-24 MED FILL — ?ESOMEPRAZOLE MAG DR 40 MG: 40 MG | 30 days supply | Qty: 30 | Fill #0

## 2016-12-24 NOTE — Patient Instructions (Signed)
Barrett Esophagus  Barrett esophagus occurs when the tissue that lines the esophagus changes or becomes damaged. The esophagus is the tube that carries food from the throat to the stomach. With Barrett esophagus, the cells that line the esophagus are replaced by cells that are similar to the lining of the intestines (intestinal metaplasia).  Barrett esophagus itself may not cause any symptoms. However, many people who have Barrett esophagus also have gastroesophageal reflux disease (GERD), which may cause symptoms such as heartburn. Treatment may include medicines, procedures to destroy the abnormal cells, or surgery. Over time, a few people with this condition may develop cancer of the esophagus.  What are the causes?  The exact cause of this condition is not known. In some cases, the condition develops from damage to the lining of the esophagus caused by GERD. GERD occurs when stomach acids flow up from the stomach into the esophagus. Frequent symptoms of GERD may cause intestinal metaplasia or cause cell changes (dysplasia).  What increases the risk?  The following factors may make you more likely to develop this condition:   Having GERD.   Being any of the following:  ? Female.  ? White (Caucasian).  ? Obese.  ? Older than 50.   Having a hiatal hernia.   Smoking.    What are the signs or symptoms?  People with Barrett esophagus often have no symptoms. However, many people with this condition also have GERD. Symptoms of GERD may include:   Heartburn.   Difficulty swallowing.   Dry cough.    How is this diagnosed?  Barrett esophagus may be diagnosed with an exam called an upper gastrointestinal endoscopy. During this exam, a thin, flexible tube (endoscope) is passed down your esophagus. The endoscope has a light and camera on the end of it. Your health care provider uses the endoscope to view the inside of your esophagus. During the exam, several tissue samples will be removed (biopsy) from your esophagus so  they can be checked for intestinal metaplasia or dysplasia.  How is this treated?  Treatment for this condition may include:   Medicines (proton pump inhibitors, or PPIs) to decrease or stop GERD.   Periodic endoscopic exams to make sure that cancer is not developing.   A procedure or surgery for dysplasia. This may include:  ? Endoscopic removal or destruction of abnormal cells.  ? Removal of part of the esophagus (esophagectomy).    Follow these instructions at home:  Eating and drinking   Eat more fruits and vegetables.   Avoid fatty foods.   Eat small, frequent meals instead of large meals.   Avoid foods that cause heartburn. These foods include:  ? Coffee and alcoholic drinks.  ? Tomatoes and foods made with tomatoes.  ? Greasy or spicy foods.  ? Chocolate and peppermint.  General instructions     Take over-the-counter and prescription medicines only as told by your health care provider.   Do not use any tobacco products, such as cigarettes, chewing tobacco, and e-cigarettes. If you need help quitting, ask your health care provider.   If your health care provider is treating you for GERD, make sure you follow all instructions and take medicines as directed.   Keep all follow-up visits as told by your health care provider. This is important.  Contact a health care provider if:   You have heartburn or GERD symptoms.   You have difficulty swallowing.  Get help right away if:   You have chest   pain.   You are unable to swallow.   You vomit blood or material that looks like coffee grounds.   Your stool (feces) is bright red or dark.  This information is not intended to replace advice given to you by your health care provider. Make sure you discuss any questions you have with your health care provider.  Document Released: 09/19/2003 Document Revised: 12/05/2015 Document Reviewed: 04/11/2015  Elsevier Interactive Patient Education  2018 Elsevier Inc[employer]

## 2016-12-24 NOTE — Progress Notes (Signed)
Subjective:  Patient ID: Haley Chandler, female    DOB: February 21, 1971  Age: 46 y.o. MRN: [id number]  CC: f/u cholecystectomy  HPI Haley Chandler is a 46 y.o. female presents s/p cholecystectomy. Cholecystectomy on 12/04/16. Feels better in regards to previous symptoms. Minimal to no nausea or abdominal pain. Incision sites are healing well but does feel mild to moderate pain of the incision over the umbilicus.     Complains of clearing of throat and cough at night. Concerned about weight loss. However, she has maintained weight from 217 to 223 lb over the last four visits since 10/02/16.   Outpatient Medications Prior to Visit  Medication Sig Dispense Refill  . celecoxib (CELEBREX) 200 MG capsule Take 1 capsule (200 mg total) by mouth 2 (two) times daily. 60 capsule 0  . ketotifen (ZADITOR) 0.025 % ophthalmic solution Place 1 drop into both eyes 2 (two) times daily. 5 mL 0  . senna-docusate (SENOKOT-S) 8.6-50 MG tablet Take 1 tablet by mouth daily as needed for mild constipation. 30 tablet 0  . esomeprazole (NEXIUM) 40 MG capsule Take 1 capsule (40 mg total) by mouth daily. 30 capsule 3  . ondansetron (ZOFRAN) 4 MG tablet Take 1 tablet (4 mg total) by mouth every 8 (eight) hours as needed for nausea or vomiting. 20 tablet 0  . oxyCODONE (OXY IR/ROXICODONE) 5 MG immediate release tablet Take 1-2 tablets (5-10 mg total) by mouth every 4 (four) hours as needed for moderate pain. 15 tablet 0   No facility-administered medications prior to visit.      ROS Review of Systems  Constitutional: Negative for chills, fever and malaise/fatigue.  Eyes: Negative for blurred vision.  Respiratory: Negative for shortness of breath.   Cardiovascular: Negative for chest pain and palpitations.  Gastrointestinal: Positive for heartburn. Negative for abdominal pain and nausea.  Genitourinary: Negative for dysuria and hematuria.  Musculoskeletal: Negative for joint pain and myalgias.  Skin: Negative for rash.   Neurological: Negative for tingling and headaches.  Psychiatric/Behavioral: Negative for depression. The patient is not nervous/anxious.     Objective:  BP 114/74 (BP Location: Left Arm, Patient Position: Sitting, Cuff Size: Large)   Pulse (!) 55   Temp 97.9 F (36.6 C) (Oral)   Wt 217 lb (98.4 kg)   LMP 12/21/2016 (Exact Date)   SpO2 100%   BMI 27.86 kg/m   BP/Weight 12/24/2016 12/06/2016 6/50/3546  Systolic BP 568 127 -  Diastolic BP 74 67 -  Wt. (Lbs) 217 - 215.83  BMI 27.86 - 27.71      Physical Exam  Constitutional: She is oriented to person, place, and time.  Well developed, overweight, NAD, polite  HENT:  Head: Normocephalic and atraumatic.  Eyes: No scleral icterus.  Cardiovascular: Normal rate, regular rhythm and normal heart sounds.   Pulmonary/Chest: Effort normal and breath sounds normal.  Abdominal: Soft. Bowel sounds are normal. There is tenderness (mild TTP over the incision site above the umbilicus).  Musculoskeletal: She exhibits no edema.  Neurological: She is alert and oriented to person, place, and time. No cranial nerve deficit. Coordination normal.  Skin: Skin is warm and dry. No rash noted. No erythema. No pallor.  All laparoscopic incision sites are clean, dry, and healing well  Psychiatric: She has a normal mood and affect. Her behavior is normal. Thought content normal.  Vitals reviewed.    Assessment & Plan:   1. Gastroesophageal reflux disease with esophagitis - Refill esomeprazole (NEXIUM) 40 MG capsule; Take 1 capsule (40  mg total) by mouth daily.  Dispense: 30 capsule; Refill: 3 - Ambulatory referral to GI for EGD to assess for esophagitis/Barrett's esophagus.  2. S/P cholecystectomy - CBC with Differential  3. Postnasal drip - Begin Benadryl 25 mg qhs  4. Loss of weight - CBC with Differential - Hepatitis panel, acute - Pt's weight stable since first visit here on 10/02/16  Meds ordered this encounter  Medications  .  DISCONTD: esomeprazole (NEXIUM) 40 MG capsule    Sig: Take 1 capsule (40 mg total) by mouth daily.    Dispense:  30 capsule    Refill:  3    Order Specific Question:   Supervising Provider    Answer:   Tresa Garter [id number]  . DISCONTD: diphenhydrAMINE (BENADRYL) 25 MG tablet    Sig: Take 1 tablet (25 mg total) by mouth every 6 (six) hours as needed.    Dispense:  30 tablet    Refill:  0    Order Specific Question:   Supervising Provider    Answer:   Tresa Garter [id number]  . esomeprazole (NEXIUM) 40 MG capsule    Sig: Take 1 capsule (40 mg total) by mouth daily.    Dispense:  30 capsule    Refill:  3    Order Specific Question:   Supervising Provider    Answer:   Tresa Garter [id number]  . diphenhydrAMINE (BENADRYL) 25 MG tablet    Sig: Take 1 tablet (25 mg total) by mouth every 6 (six) hours as needed.    Dispense:  30 tablet    Refill:  0    Order Specific Question:   Supervising Provider    Answer:   Tresa Garter [id number]    Follow-up: Return in about 8 weeks (around 02/18/2017).   Clent Demark PA

## 2016-12-25 ENCOUNTER — Encounter: Payer: Self-pay | Admitting: Internal Medicine

## 2016-12-25 LAB — CBC WITH DIFFERENTIAL/PLATELET
BASOS: 1 %
Basophils Absolute: 0 10*3/uL (ref 0.0–0.2)
EOS (ABSOLUTE): 0.1 10*3/uL (ref 0.0–0.4)
Eos: 3 %
Hematocrit: 34.5 % (ref 34.0–46.6)
Hemoglobin: 11.5 g/dL (ref 11.1–15.9)
IMMATURE GRANULOCYTES: 0 %
Immature Grans (Abs): 0 10*3/uL (ref 0.0–0.1)
Lymphocytes Absolute: 2.1 10*3/uL (ref 0.7–3.1)
Lymphs: 57 %
MCH: 30.7 pg (ref 26.6–33.0)
MCHC: 33.3 g/dL (ref 31.5–35.7)
MCV: 92 fL (ref 79–97)
MONOS ABS: 0.2 10*3/uL (ref 0.1–0.9)
Monocytes: 6 %
NEUTROS PCT: 33 %
Neutrophils Absolute: 1.2 10*3/uL — ABNORMAL LOW (ref 1.4–7.0)
Platelets: 408 10*3/uL — ABNORMAL HIGH (ref 150–379)
RBC: 3.74 x10E6/uL — ABNORMAL LOW (ref 3.77–5.28)
RDW: 13.2 % (ref 12.3–15.4)
WBC: 3.7 10*3/uL (ref 3.4–10.8)

## 2016-12-25 LAB — HEPATITIS PANEL, ACUTE
HEP B C IGM: NEGATIVE
Hep A IgM: NEGATIVE
Hep C Virus Ab: 0.1 s/co ratio (ref 0.0–0.9)
Hepatitis B Surface Ag: NEGATIVE

## 2016-12-31 ENCOUNTER — Encounter (INDEPENDENT_AMBULATORY_CARE_PROVIDER_SITE_OTHER): Payer: Self-pay | Admitting: Physician Assistant

## 2016-12-31 ENCOUNTER — Ambulatory Visit (INDEPENDENT_AMBULATORY_CARE_PROVIDER_SITE_OTHER): Payer: Self-pay | Admitting: Physician Assistant

## 2016-12-31 ENCOUNTER — Telehealth (INDEPENDENT_AMBULATORY_CARE_PROVIDER_SITE_OTHER): Payer: Self-pay | Admitting: *Deleted

## 2016-12-31 VITALS — BP 113/73 | HR 76 | Temp 98.8°F | Resp 18 | Ht 74.0 in | Wt 212.0 lb

## 2016-12-31 DIAGNOSIS — R1013 Epigastric pain: Secondary | ICD-10-CM

## 2016-12-31 DIAGNOSIS — F411 Generalized anxiety disorder: Secondary | ICD-10-CM

## 2016-12-31 DIAGNOSIS — R1310 Dysphagia, unspecified: Secondary | ICD-10-CM

## 2016-12-31 MED ORDER — CLONAZEPAM 1 MG PO TABS: 1.0000 mg | ORAL_TABLET | Freq: Two times a day (BID) | ORAL | 1 refills | Status: DC | PRN

## 2016-12-31 MED ORDER — DEXLANSOPRAZOLE 60 MG PO CPDR: 60.0000 mg | DELAYED_RELEASE_CAPSULE | Freq: Every day | ORAL | 2 refills | Status: DC

## 2016-12-31 MED FILL — DEXILANT DR 60 MG CAPSULE: 60 | 30 days supply | Qty: 30 | Fill #0

## 2016-12-31 NOTE — Patient Instructions (Signed)
Barium Swallow A barium swallow is an X-ray exam that is used to evaluate the area at the back of the throat (pharynx) and the tube that carries food and liquid from the mouth to the stomach (esophagus). For this exam, you will swallow a white chalky liquid called barium. X-rays are done while the barium passes through the areas that are being checked. The barium shows up well on X-rays, making it easier for your health care provider to see possible problems. A barium swallow may be done to check for various problems, such as:  Ulcers.  Tumors.  Inflammation of the esophagus.  Hiatal hernia. This is a condition in which the upper part of the stomach slides into the lower chest.  Scarring.  Blockages.  Problems with the muscular wall of the pharynx and esophagus.  Your health care provider may recommend this procedure to help make a diagnosis if you have any of these symptoms:  Difficulty swallowing.  Chest pain that is not related to the heart.  Gastroesophageal reflux, which is a backward flow of stomach contents into the esophagus.  Unexplained vomiting.  Severe indigestion.  Tell a health care provider about:  Any allergies you have, especially allergies to contrast materials.  All medicines you are taking, including vitamins, herbs, eye drops, creams, and over-the-counter medicines.  Any blood disorders you have.  Any surgeries you have had.  Any medical conditions you have.  If you are pregnant or you think that you may be pregnant. What are the risks? Generally, this is a safe procedure. However, problems may occur, including:  Constipation.  Fecal impaction.  Exposure to radiation (a small amount).  Allergic reaction to the barium. This is rare.  What happens before the procedure?  Follow your health care provider's instructions about eating or drinking restrictions.  Ask your health care provider about changing or stopping your regular medicines. This  is especially important if you are taking diabetes medicines or blood thinners. What happens during the procedure?  You will be positioned on an X-ray table.  You will be asked to drink the liquid barium, which looks like a light-colored milkshake. You will probably drink the barium through a straw.  During the procedure, the X-ray table may be moved to a more upright angle. You may also be asked to shift your position on the table. This will allow your entire esophagus to be viewed.  The health care provider will watch the barium flow through your esophagus using a type of X-ray that allows images to be viewed on a monitor in a movie-like sequence (fluoroscopy). X-ray images will also be stored for later viewing. The procedure may vary among health care providers and hospitals. What happens after the procedure?  Return to your normal activities and your normal diet as directed by your health care provider.  Your stool (feces) may be white or gray for 2-3 days until all of the barium has passed out of your body in your stool. You may be given a laxative to take to help remove the barium from your body.  Your health care provider may recommend other things to help prevent constipation after this procedure, including: ? Drinking enough fluid to keep your urine clear or pale yellow. ? Eating foods that are high in fiber, such as fruits, vegetables, whole grains, and beans.  Call your health care provider if: ? You have difficulty having bowel movements, or you are not able to have a bowel movement or to pass  gas. ? You have belly (abdominal) pain or swelling of your abdomen. ? You have a fever.  It is your responsibility to obtain your test results. Ask your health care provider or the department performing the test when and how you will get your results. This information is not intended to replace advice given to you by your health care provider. Make sure you discuss any questions you have  with your health care provider. Document Released: 11/10/2006 Document Revised: 12/05/2015 Document Reviewed: 04/10/2014 Elsevier Interactive Patient Education  [employer].

## 2016-12-31 NOTE — Progress Notes (Signed)
Subjective:  Patient ID: Haley Chandler, female    DOB: 27-Jul-1970  Age: 46 y.o. MRN: [id number]  CC: sore throat  HPI Haley Chandler is a 46 y.o. female with a recent history of cholecystectomy presents with throat pain after swallowing. The issue is chronic for several months now. Says the pain occurs not when she is actually swallowing but when the bolus gets "stuck" or when the bolus comes back up. There is associated epigastric pain and back pain. She is scared to eat because of the pain. Says she is losing weight and is unable to sleep well due to her concerns. Has obtained an EGD appointment for July 17th but says the appointment is too delayed. Esomeprazole is no longer effective. Denies f/c/n/v, CP, SOB, HA, rash, swelling, or GU sxs.   Outpatient Medications Prior to Visit  Medication Sig Dispense Refill  . diphenhydrAMINE (BENADRYL) 25 MG tablet Take 1 tablet (25 mg total) by mouth every 6 (six) hours as needed. 30 tablet 0  . ketotifen (ZADITOR) 0.025 % ophthalmic solution Place 1 drop into both eyes 2 (two) times daily. 5 mL 0  . esomeprazole (NEXIUM) 40 MG capsule Take 1 capsule (40 mg total) by mouth daily. 30 capsule 3  . senna-docusate (SENOKOT-S) 8.6-50 MG tablet Take 1 tablet by mouth daily as needed for mild constipation. 30 tablet 0  . celecoxib (CELEBREX) 200 MG capsule Take 1 capsule (200 mg total) by mouth 2 (two) times daily. (Patient not taking: Reported on 12/31/2016) 60 capsule 0   No facility-administered medications prior to visit.      ROS Review of Systems  Constitutional: Negative for chills, fever and malaise/fatigue.  HENT: Positive for sore throat.   Eyes: Negative for blurred vision.  Respiratory: Negative for shortness of breath.   Cardiovascular: Negative for chest pain and palpitations.  Gastrointestinal: Positive for abdominal pain. Negative for nausea and vomiting.  Genitourinary: Negative for dysuria and hematuria.  Musculoskeletal: Negative for joint  pain and myalgias.  Skin: Negative for rash.  Neurological: Negative for tingling and headaches.  Psychiatric/Behavioral: Negative for depression. The patient is not nervous/anxious.     Objective:  BP 113/73 (BP Location: Left Arm, Patient Position: Sitting, Cuff Size: Normal)   Pulse 76   Temp 98.8 F (37.1 C) (Oral)   Resp 18   Ht 6\' 2"  (1.88 m)   Wt 212 lb (96.2 kg)   LMP 12/21/2016 (Exact Date)   SpO2 100%   BMI 27.22 kg/m   BP/Weight 12/31/2016 12/24/2016 0/99/8338  Systolic BP 250 539 767  Diastolic BP 73 74 67  Wt. (Lbs) 212 217 -  BMI 27.22 27.86 -      Physical Exam  Constitutional: She is oriented to person, place, and time.  Well developed, well nourished, NAD, polite  HENT:  Head: Normocephalic and atraumatic.  Mild oropharyngeal erythema, tonsils 1+, tongue normal, no drooling, no speech impediment  Eyes: No scleral icterus.  Neck: Normal range of motion. Neck supple.  Cardiovascular: Normal rate, regular rhythm and normal heart sounds.   Pulmonary/Chest: Effort normal and breath sounds normal.  Abdominal: Soft. Bowel sounds are normal. There is tenderness (Tenderness of the epigastrium).  Musculoskeletal: She exhibits no edema.  Neurological: She is alert and oriented to person, place, and time. No cranial nerve deficit. Coordination normal.  Skin: Skin is warm and dry. No rash noted. No erythema. No pallor.  Psychiatric: Her behavior is normal. Thought content normal.  Somewhat anxious/worried  Vitals reviewed.  Assessment & Plan:   1. Dysphagia, unspecified type - Stop Nexium - Begin dexlansoprazole (DEXILANT) 60 MG capsule; Take 1 capsule (60 mg total) by mouth daily.  Dispense: 30 capsule; Refill: 2 - DG Esophagus; Future  2. Anxiety state - Begin clonazePAM (KLONOPIN) 1 MG tablet; Take 1 tablet (1 mg total) by mouth 2 (two) times daily as needed for anxiety.  Dispense: 20 tablet; Refill: 1   Meds ordered this encounter  Medications  .  clonazePAM (KLONOPIN) 1 MG tablet    Sig: Take 1 tablet (1 mg total) by mouth 2 (two) times daily as needed for anxiety.    Dispense:  20 tablet    Refill:  1    Order Specific Question:   Supervising Provider    Answer:   Tresa Garter [id number]  . dexlansoprazole (DEXILANT) 60 MG capsule    Sig: Take 1 capsule (60 mg total) by mouth daily.    Dispense:  30 capsule    Refill:  2    Order Specific Question:   Supervising Provider    Answer:   Tresa Garter [id number]    Follow-up: Return if symptoms worsen or fail to improve.   Clent Demark PA

## 2016-12-31 NOTE — Telephone Encounter (Signed)
Patient verified DOB Patient is aware of barium swallowing test being next Thursday at the [facility] on 01/07/17 at 9:30am.

## 2017-01-01 LAB — CBC WITH DIFFERENTIAL/PLATELET
BASOS ABS: 0 10*3/uL (ref 0.0–0.2)
Basos: 1 %
EOS (ABSOLUTE): 0.1 10*3/uL (ref 0.0–0.4)
Eos: 2 %
HEMOGLOBIN: 12 g/dL (ref 11.1–15.9)
Hematocrit: 35.6 % (ref 34.0–46.6)
IMMATURE GRANS (ABS): 0 10*3/uL (ref 0.0–0.1)
Immature Granulocytes: 0 %
LYMPHS: 50 %
Lymphocytes Absolute: 1.7 10*3/uL (ref 0.7–3.1)
MCH: 30.5 pg (ref 26.6–33.0)
MCHC: 33.7 g/dL (ref 31.5–35.7)
MCV: 90 fL (ref 79–97)
MONOCYTES: 8 %
Monocytes Absolute: 0.3 10*3/uL (ref 0.1–0.9)
NEUTROS PCT: 39 %
Neutrophils Absolute: 1.3 10*3/uL — ABNORMAL LOW (ref 1.4–7.0)
Platelets: 410 10*3/uL — ABNORMAL HIGH (ref 150–379)
RBC: 3.94 x10E6/uL (ref 3.77–5.28)
RDW: 12.9 % (ref 12.3–15.4)
WBC: 3.4 10*3/uL (ref 3.4–10.8)

## 2017-01-01 LAB — LIPASE: Lipase: 55 U/L (ref 14–72)

## 2017-01-01 LAB — COMPREHENSIVE METABOLIC PANEL
A/G RATIO: 1.6 (ref 1.2–2.2)
ALT: 19 IU/L (ref 0–32)
AST: 18 IU/L (ref 0–40)
Albumin: 4.5 g/dL (ref 3.5–5.5)
Alkaline Phosphatase: 35 IU/L — ABNORMAL LOW (ref 39–117)
BILIRUBIN TOTAL: 0.8 mg/dL (ref 0.0–1.2)
BUN/Creatinine Ratio: 11 (ref 9–23)
BUN: 9 mg/dL (ref 6–24)
CHLORIDE: 103 mmol/L (ref 96–106)
CO2: 23 mmol/L (ref 20–29)
Calcium: 9.6 mg/dL (ref 8.7–10.2)
Creatinine, Ser: 0.84 mg/dL (ref 0.57–1.00)
GFR calc Af Amer: 96 mL/min/{1.73_m2} (ref >59)
GFR, EST NON AFRICAN AMERICAN: 84 mL/min/{1.73_m2} (ref >59)
GLOBULIN, TOTAL: 2.8 g/dL (ref 1.5–4.5)
Glucose: 75 mg/dL (ref 65–99)
POTASSIUM: 4 mmol/L (ref 3.5–5.2)
SODIUM: 140 mmol/L (ref 134–144)
Total Protein: 7.3 g/dL (ref 6.0–8.5)

## 2017-01-07 ENCOUNTER — Telehealth (INDEPENDENT_AMBULATORY_CARE_PROVIDER_SITE_OTHER): Payer: Self-pay

## 2017-01-07 ENCOUNTER — Ambulatory Visit (HOSPITAL_COMMUNITY)
Admission: RE | Admit: 2017-01-07 | Discharge: 2017-01-07 | Disposition: A | Discharge status: Home / Self Care | Payer: Self-pay | Source: Ambulatory Visit | Attending: Physician Assistant | Admitting: Physician Assistant

## 2017-01-07 DIAGNOSIS — R1310 Dysphagia, unspecified: Secondary | ICD-10-CM | POA: Insufficient documentation

## 2017-01-07 NOTE — Telephone Encounter (Signed)
Patient notified of results. Expressed understanding. Nat Christen, CMA

## 2017-01-11 ENCOUNTER — Ambulatory Visit (INDEPENDENT_AMBULATORY_CARE_PROVIDER_SITE_OTHER): Payer: Self-pay | Admitting: Physician Assistant

## 2017-01-11 ENCOUNTER — Encounter (INDEPENDENT_AMBULATORY_CARE_PROVIDER_SITE_OTHER): Payer: Self-pay | Admitting: Physician Assistant

## 2017-01-11 VITALS — BP 124/71 | HR 56 | Temp 97.9°F | Resp 16 | Ht 74.0 in | Wt 209.2 lb

## 2017-01-11 DIAGNOSIS — F411 Generalized anxiety disorder: Secondary | ICD-10-CM

## 2017-01-11 DIAGNOSIS — R0982 Postnasal drip: Secondary | ICD-10-CM

## 2017-01-11 MED ORDER — CETIRIZINE HCL 10 MG PO TABS: 10.0000 mg | ORAL_TABLET | Freq: Every day | ORAL | 11 refills | Status: DC

## 2017-01-11 MED ORDER — ESCITALOPRAM OXALATE 10 MG PO TABS: 10.0000 mg | ORAL_TABLET | Freq: Every day | ORAL | 2 refills | Status: DC

## 2017-01-11 MED ORDER — HYDROXYZINE HCL 10 MG PO TABS: 10.0000 mg | ORAL_TABLET | Freq: Every day | ORAL | 0 refills | Status: DC

## 2017-01-11 MED FILL — hydrOXYzine HCL 10 MG TABS: 10 | 30 days supply | Qty: 30 | Fill #0

## 2017-01-11 MED FILL — ?ESCITALOPRAM 10 MG TABLET: 10 | 30 days supply | Qty: 30 | Fill #0

## 2017-01-11 MED FILL — ?CETIRIZINE HCL 10 MG TABLE: 10 | 30 days supply | Qty: 30 | Fill #0

## 2017-01-11 NOTE — Progress Notes (Signed)
Subjective:  Patient ID: Haley Chandler, female    DOB: 1971-04-23  Age: 46 y.o. MRN: [id number]  CC: throat congestion  HPI Haley Chandler is a 46 y.o. female presents with "throat congestion" for approximately 2-3 days. Says she has a feeling of excess mucus in the throat. There is occasional sinus congestion associated to "throat congestion". She took a Mucinex from a friend and felt some moderate relief.  Recently had a barium swallow done with no finding of motility dysfunction or esophageal mucosal change. However, a faint penetration of the contrast was seen in the esophagus. Pt has an upcoming EGD scheduled already. Pt does not endorse throat pain, swelling, odynophagia, f/c//n/v, hemoptysis, or cough. Lastly patient reports having taken clonazepam and was able to sleep well and did not feel throat congestion.     Also complaints of very dark stools. Has been taking Pepto Bismal for stomach upset. Black stools stopped once she stopped Pepto Bismal. Does not complain of abdominal pain, abdominal bloating, fever, chills, hematochezia.     Outpatient Medications Prior to Visit  Medication Sig Dispense Refill  . clonazePAM (KLONOPIN) 1 MG tablet Take 1 tablet (1 mg total) by mouth 2 (two) times daily as needed for anxiety. 20 tablet 1  . dexlansoprazole (DEXILANT) 60 MG capsule Take 1 capsule (60 mg total) by mouth daily. 30 capsule 2  . ketotifen (ZADITOR) 0.025 % ophthalmic solution Place 1 drop into both eyes 2 (two) times daily. 5 mL 0  . diphenhydrAMINE (BENADRYL) 25 MG tablet Take 1 tablet (25 mg total) by mouth every 6 (six) hours as needed. 30 tablet 0   No facility-administered medications prior to visit.      ROS Review of Systems  Constitutional: Negative for chills, fever and malaise/fatigue.  HENT: Positive for congestion ("throat"). Negative for sinus pain and sore throat.   Eyes: Negative for blurred vision.  Respiratory: Negative for shortness of breath.   Cardiovascular:  Negative for chest pain and palpitations.  Gastrointestinal: Negative for abdominal pain and nausea.  Genitourinary: Negative for dysuria and hematuria.  Musculoskeletal: Negative for joint pain and myalgias.  Skin: Negative for rash.  Neurological: Negative for tingling and headaches.  Psychiatric/Behavioral: Negative for depression. The patient is not nervous/anxious.     Objective:  BP 124/71 (BP Location: Left Arm, Patient Position: Sitting, Cuff Size: Large)   Pulse (!) 56   Temp 97.9 F (36.6 C) (Oral)   Resp 16   Ht 6\' 2"  (1.88 m)   Wt 209 lb 3.2 oz (94.9 kg)   LMP 12/21/2016 (Exact Date)   SpO2 100%   BMI 26.86 kg/m   BP/Weight 01/11/2017 12/31/2016 5/63/8756  Systolic BP 433 295 188  Diastolic BP 71 73 74  Wt. (Lbs) 209.2 212 217  BMI 26.86 27.22 27.86      Physical Exam  Constitutional: She is oriented to person, place, and time.  Well developed, well nourished, NAD, polite  HENT:  Head: Normocephalic and atraumatic.  Tonsils 1+, nonerythematous, no exudates. Mild postnasal drip noted. Mildly hypertrophic turbinates with mild pallor.   Eyes: Conjunctivae are normal. No scleral icterus.  Neck: Normal range of motion. Neck supple.  Cardiovascular: Normal rate, regular rhythm and normal heart sounds.   Pulmonary/Chest: Effort normal and breath sounds normal.  Abdominal: Soft. Bowel sounds are normal. There is no tenderness.  Musculoskeletal: She exhibits no edema.  Lymphadenopathy:    She has no cervical adenopathy.  Neurological: She is alert and oriented to person, place,  and time. No cranial nerve deficit. Coordination normal.  Skin: Skin is warm and dry. No rash noted. No erythema. No pallor.  Psychiatric: She has a normal mood and affect. Her behavior is normal. Thought content normal.  Vitals reviewed.    Assessment & Plan:   1. Postnasal drip - Allergens, Zone 2 - Begin cetirizine (ZYRTEC) 10 MG tablet; Take 1 tablet (10 mg total) by mouth daily.   Dispense: 30 tablet; Refill: 11 - Begin hydrOXYzine (ATARAX/VISTARIL) 10 MG tablet; Take 1 tablet (10 mg total) by mouth at bedtime.  Dispense: 30 tablet; Refill: 0 - Begin escitalopram (LEXAPRO) 10 MG tablet; Take 1 tablet (10 mg total) by mouth daily.  Dispense: 30 tablet; Refill: 2 - Stop Benadryl  2. Generalized anxiety disorder - Begin hydrOXYzine (ATARAX/VISTARIL) 10 MG tablet; Take 1 tablet (10 mg total) by mouth at bedtime.  Dispense: 30 tablet; Refill: 0 - Begin escitalopram (LEXAPRO) 10 MG tablet; Take 1 tablet (10 mg total) by mouth daily.  Dispense: 30 tablet; Refill: 2 - Continue clonazepam  Meds ordered this encounter  Medications  . DISCONTD: hydrOXYzine (ATARAX/VISTARIL) 10 MG tablet    Sig: Take 1 tablet (10 mg total) by mouth at bedtime.    Dispense:  30 tablet    Refill:  0    Order Specific Question:   Supervising Provider    Answer:   Tresa Garter [id number]  . DISCONTD: cetirizine (ZYRTEC) 10 MG tablet    Sig: Take 1 tablet (10 mg total) by mouth daily.    Dispense:  30 tablet    Refill:  11    Order Specific Question:   Supervising Provider    Answer:   Tresa Garter [id number]  . DISCONTD: escitalopram (LEXAPRO) 10 MG tablet    Sig: Take 1 tablet (10 mg total) by mouth daily.    Dispense:  30 tablet    Refill:  2    Order Specific Question:   Supervising Provider    Answer:   Tresa Garter [id number]  . cetirizine (ZYRTEC) 10 MG tablet    Sig: Take 1 tablet (10 mg total) by mouth daily.    Dispense:  30 tablet    Refill:  11    Order Specific Question:   Supervising Provider    Answer:   Tresa Garter [id number]  . hydrOXYzine (ATARAX/VISTARIL) 10 MG tablet    Sig: Take 1 tablet (10 mg total) by mouth at bedtime.    Dispense:  30 tablet    Refill:  0    Order Specific Question:   Supervising Provider    Answer:   Tresa Garter [id number]  . escitalopram (LEXAPRO) 10 MG tablet    Sig: Take 1 tablet (10 mg total) by mouth  daily.    Dispense:  30 tablet    Refill:  2    Order Specific Question:   Supervising Provider    Answer:   Tresa Garter [id number]    Follow-up: Return if symptoms worsen or fail to improve.   Clent Demark PA

## 2017-01-11 NOTE — Patient Instructions (Signed)
Allergic Rhinitis Allergic rhinitis is when the mucous membranes in the nose respond to allergens. Allergens are particles in the air that cause your body to have an allergic reaction. This causes you to release allergic antibodies. Through a chain of events, these eventually cause you to release histamine into the blood stream. Although meant to protect the body, it is this release of histamine that causes your discomfort, such as frequent sneezing, congestion, and an itchy, runny nose. What are the causes? Seasonal allergic rhinitis (hay fever) is caused by pollen allergens that may come from grasses, trees, and weeds. Year-round allergic rhinitis (perennial allergic rhinitis) is caused by allergens such as house dust mites, pet dander, and mold spores. What are the signs or symptoms?  Nasal stuffiness (congestion).  Itchy, runny nose with sneezing and tearing of the eyes. How is this diagnosed? Your health care provider can help you determine the allergen or allergens that trigger your symptoms. If you and your health care provider are unable to determine the allergen, skin or blood testing may be used. Your health care provider will diagnose your condition after taking your health history and performing a physical exam. Your health care provider may assess you for other related conditions, such as asthma, pink eye, or an ear infection. How is this treated? Allergic rhinitis does not have a cure, but it can be controlled by:  Medicines that block allergy symptoms. These may include allergy shots, nasal sprays, and oral antihistamines.  Avoiding the allergen. Hay fever may often be treated with antihistamines in pill or nasal spray forms. Antihistamines block the effects of histamine. There are over-the-counter medicines that may help with nasal congestion and swelling around the eyes. Check with your health care provider before taking or giving this medicine. If avoiding the allergen or the  medicine prescribed do not work, there are many new medicines your health care provider can prescribe. Stronger medicine may be used if initial measures are ineffective. Desensitizing injections can be used if medicine and avoidance does not work. Desensitization is when a patient is given ongoing shots until the body becomes less sensitive to the allergen. Make sure you follow up with your health care provider if problems continue. Follow these instructions at home: It is not possible to completely avoid allergens, but you can reduce your symptoms by taking steps to limit your exposure to them. It helps to know exactly what you are allergic to so that you can avoid your specific triggers. Contact a health care provider if:  You have a fever.  You develop a cough that does not stop easily (persistent).  You have shortness of breath.  You start wheezing.  Symptoms interfere with normal daily activities. This information is not intended to replace advice given to you by your health care provider. Make sure you discuss any questions you have with your health care provider. Document Released: 03/24/2001 Document Revised: 02/28/2016 Document Reviewed: 03/06/2013 Elsevier Interactive Patient Education  2017 Elsevier Inc.  

## 2017-01-12 ENCOUNTER — Ambulatory Visit (INDEPENDENT_AMBULATORY_CARE_PROVIDER_SITE_OTHER): Payer: Self-pay | Admitting: Physician Assistant

## 2017-01-13 LAB — ALLERGENS, ZONE 2
Amer Sycamore IgE Qn: <0.1 kU/L
Aspergillus Fumigatus IgE: <0.1 kU/L
Bahia Grass IgE: <0.1 kU/L
Cladosporium Herbarum IgE: <0.1 kU/L
Cockroach, American IgE: <0.1 kU/L
Common Silver Birch IgE: <0.1 kU/L
D Farinae IgE: <0.1 kU/L
D Pteronyssinus IgE: <0.1 kU/L
Dog Dander IgE: <0.1 kU/L
Elm, American IgE: <0.1 kU/L
Johnson Grass IgE: <0.1 kU/L
Maple/Box Elder IgE: <0.1 kU/L
Mugwort IgE Qn: <0.1 kU/L
Pigweed, Rough IgE: <0.1 kU/L
Ragweed, Short IgE: <0.1 kU/L
Sheep Sorrel IgE Qn: <0.1 kU/L
White Mulberry IgE: <0.1 kU/L

## 2017-02-05 ENCOUNTER — Encounter: Payer: Self-pay | Admitting: Internal Medicine

## 2017-02-05 ENCOUNTER — Ambulatory Visit (INDEPENDENT_AMBULATORY_CARE_PROVIDER_SITE_OTHER): Payer: Self-pay | Admitting: Internal Medicine

## 2017-02-05 VITALS — BP 126/72 | HR 82 | Ht 74.0 in | Wt 221.0 lb

## 2017-02-05 DIAGNOSIS — R1310 Dysphagia, unspecified: Secondary | ICD-10-CM

## 2017-02-05 DIAGNOSIS — K219 Gastro-esophageal reflux disease without esophagitis: Secondary | ICD-10-CM

## 2017-02-05 MED ORDER — DEXLANSOPRAZOLE 60 MG PO CPDR: 60.0000 mg | DELAYED_RELEASE_CAPSULE | Freq: Every day | ORAL | 6 refills | Status: DC

## 2017-02-05 NOTE — Progress Notes (Signed)
HISTORY OF PRESENT ILLNESS:  Haley Chandler is a 46 y.o. female , [place] native, who sent today by Mr. Altamease Oiler, [place] with possible acid reflux and dysphagia. Patient reports developing problems with regurgitation in March. She was diagnosed with GERD and placed on omeprazole. She took this for one month without improvement. Subsequently change to Nexium for one month without improvement. She did undergo cholecystectomy in May. As part of the evaluation of her current symptoms she underwent esophagram 01/07/2017. No significant abnormality noted. 13 mm barium tablet passed without difficulty. She does describe fairly clear intermittent solid food dysphagia despite the esophagram. She tells me that she was placed on Dexilant about one month ago. She states that her symptoms have improved over the past 2 weeks. She is requesting medication refill. She does describe significant problems with mucous in the back of her throat. She was treated as allergic rhinitis.  REVIEW OF SYSTEMS:  All non-GI ROS negative except for allergies, cough, excessive urination, ankle swelling  Past Medical History:  Diagnosis Date  . GERD (gastroesophageal reflux disease)     Past Surgical History:  Procedure Laterality Date  . CHOLECYSTECTOMY N/A 12/05/2016   Procedure: LAPAROSCOPIC CHOLECYSTECTOMY WITH INTRAOPERATIVE CHOLANGIOGRAM;  Surgeon: Judeth Horn, MD;  Location: [facility];  Service: General;  Laterality: N/A;  . TUBAL LIGATION      Social History Rexanne Justus  reports that she has never smoked. She has never used smokeless tobacco. She reports that she drinks alcohol. She reports that she does not use drugs.  family history is not on file.  No Known Allergies     PHYSICAL EXAMINATION: Vital signs: BP 126/72 (BP Location: Left Arm, Patient Position: Sitting, Cuff Size: Normal)   Pulse 82   Ht 6\' 2"  (1.88 m)   Wt 221 lb (100.2 kg)   BMI 28.37 kg/m   Constitutional: generally well-appearing, no acute  distress Psychiatric: alert and oriented x3, cooperative Eyes: extraocular movements intact, anicteric, conjunctiva pink Mouth: oral pharynx moist, no lesions Neck: supple no lymphadenopathy Cardiovascular: heart regular rate and rhythm, no murmur Lungs: clear to auscultation bilaterally Abdomen: soft, nontender, nondistended, no obvious ascites, no peritoneal signs, normal bowel sounds, no organomegaly Rectal:Omitted Extremities: no clubbing cyanosis or lower extremity edema bilaterally Skin: no lesions on visible extremities Neuro: No focal deficits. Cranial nerves intact  ASSESSMENT:  #1. Intermittent solid food dysphagia. Rule out stricture or ring #2. Probable GERD. Somewhat atypical #3. Uncertain regarding pharyngeal complaints as to the etiology. Possibly GERD   PLAN:  #1. Refill60 mg daily as this seems to be helping currently #2. Schedule upper endoscopy with possible esophageal dilation.The nature of the procedure, as well as the risks, benefits, and alternatives were carefully and thoroughly reviewed with the patient. Ample time for discussion and questions allowed. The patient understood, was satisfied, and agreed to proceed. #3. Ongoing general medical care with PCP #4. Final GI disposition after the above completed  A copy of this consultation note has been sent to Mr. Altamease Oiler

## 2017-02-05 NOTE — Patient Instructions (Signed)
We have sent the following medications to your pharmacy for you to pick up at your convenience:  Dexilant  You have been scheduled for an endoscopy. Please follow written instructions given to you at your visit today. If you use inhalers (even only as needed), please bring them with you on the day of your procedure. Your physician has requested that you go to www.startemmi.com and enter the access code given to you at your visit today. This web site gives a general overview about your procedure. However, you should still follow specific instructions given to you by our office regarding your preparation for the procedure.   

## 2017-02-10 ENCOUNTER — Telehealth (INDEPENDENT_AMBULATORY_CARE_PROVIDER_SITE_OTHER): Payer: Self-pay | Admitting: Physician Assistant

## 2017-02-10 NOTE — Telephone Encounter (Signed)
Per patient ignore message, Haley Chandler informed her they do not accept OC for this medication.  Per patient will go apply for Pharm assistance at [facility]. Has appt to apply for medication assistance.

## 2017-02-10 NOTE — Telephone Encounter (Signed)
Patient came in stated needs RX dexlansoprazole (DEXILANT) 60 MG capsule  Per patient [facility] does not have it. Please send to [facility]   Please follow up with patient.

## 2017-02-16 ENCOUNTER — Telehealth: Payer: Self-pay

## 2017-02-16 MED ORDER — LANSOPRAZOLE 30 MG PO CPDR: 30.0000 mg | DELAYED_RELEASE_CAPSULE | Freq: Every day | ORAL | 3 refills | Status: DC

## 2017-02-16 NOTE — Telephone Encounter (Signed)
Spoke to [employer] and wellness regarding patient's application for assistance with Dexilant.  Patient was unable to provide a SSN so application could not proceed.  Pharmacy was able to give her Prevacid for free so we switched her PPI to that.

## 2017-02-22 ENCOUNTER — Ambulatory Visit (INDEPENDENT_AMBULATORY_CARE_PROVIDER_SITE_OTHER): Payer: Self-pay | Admitting: Physician Assistant

## 2017-02-22 ENCOUNTER — Encounter (INDEPENDENT_AMBULATORY_CARE_PROVIDER_SITE_OTHER): Payer: Self-pay | Admitting: Physician Assistant

## 2017-02-22 VITALS — BP 115/80 | HR 56 | Temp 98.2°F | Wt 223.6 lb

## 2017-02-22 DIAGNOSIS — M549 Dorsalgia, unspecified: Secondary | ICD-10-CM

## 2017-02-22 DIAGNOSIS — R350 Frequency of micturition: Secondary | ICD-10-CM

## 2017-02-22 DIAGNOSIS — F411 Generalized anxiety disorder: Secondary | ICD-10-CM

## 2017-02-22 LAB — POCT URINALYSIS DIPSTICK
Bilirubin, UA: NEGATIVE
GLUCOSE UA: NEGATIVE
Ketones, UA: NEGATIVE
LEUKOCYTES UA: NEGATIVE
NITRITE UA: NEGATIVE
PROTEIN UA: NEGATIVE
UROBILINOGEN UA: 0.2 U/dL
pH, UA: 5 (ref 5.0–8.0)

## 2017-02-22 MED ORDER — ESCITALOPRAM OXALATE 20 MG PO TABS: 20.0000 mg | ORAL_TABLET | Freq: Every day | ORAL | 1 refills | Status: DC

## 2017-02-22 MED ORDER — CLONAZEPAM 0.5 MG PO TABS: 0.5000 mg | ORAL_TABLET | Freq: Every day | ORAL | 0 refills | Status: DC

## 2017-02-22 MED ORDER — CELECOXIB 200 MG PO CAPS: 200.0000 mg | ORAL_CAPSULE | Freq: Two times a day (BID) | ORAL | 2 refills | Status: DC

## 2017-02-22 NOTE — Progress Notes (Signed)
Subjective:  Patient ID: Haley Chandler, female    DOB: 01/08/1971  Age: 46 y.o. MRN: [id number]  CC: left flank pain  HPI Haley Chandler is a 46 y.o. female with a PMH of cholecystectomy and GAD presents with recent onset of LBP and urinary frequency. She is worried she may have a UTI. No associated dysuria, suprapubic pressure/pain, hematuria, f/c/n/v, or thoracic pain. Pain begins in the left mid flank and radiates to left buttocks, left anterior hip, and lastly to the anterolateral aspect of left thigh. Endorses chronic epigastric pain unchanged from baseline. Has EGD tomorrow. No other symptoms or complaints. She is unsure if she is still taking Escitalopram.     Outpatient Medications Prior to Visit  Medication Sig Dispense Refill  . dexlansoprazole (DEXILANT) 60 MG capsule Take 1 capsule (60 mg total) by mouth daily. 30 capsule 6  . lansoprazole (PREVACID) 30 MG capsule Take 1 capsule (30 mg total) by mouth daily at 12 noon. 30 capsule 3   No facility-administered medications prior to visit.      ROS Review of Systems  Constitutional: Negative for chills, fever and malaise/fatigue.  Eyes: Negative for blurred vision.  Respiratory: Negative for shortness of breath.   Cardiovascular: Negative for chest pain and palpitations.  Gastrointestinal: Positive for abdominal pain (mid left flank pain). Negative for nausea.  Genitourinary: Negative for dysuria and hematuria.  Musculoskeletal: Negative for joint pain and myalgias.  Skin: Negative for rash.  Neurological: Negative for tingling and headaches.  Psychiatric/Behavioral: Negative for depression. The patient is not nervous/anxious.     Objective:  BP 115/80 (BP Location: Left Arm, Patient Position: Sitting, Cuff Size: Large)   Pulse (!) 56   Temp 98.2 F (36.8 C) (Oral)   Wt 223 lb 9.6 oz (101.4 kg)   LMP 02/16/2017 (Exact Date)   SpO2 100%   BMI 28.71 kg/m   BP/Weight 02/22/2017 03/16/91 09/13/74  Systolic BP 226 333 545   Diastolic BP 80 72 71  Wt. (Lbs) 223.6 221 209.2  BMI 28.71 28.37 26.86      Physical Exam  Constitutional: She is oriented to person, place, and time.  Well developed, well nourished, NAD, polite  HENT:  Head: Normocephalic and atraumatic.  Eyes: No scleral icterus.  Cardiovascular: Normal rate, regular rhythm and normal heart sounds.   Pulmonary/Chest: Effort normal and breath sounds normal.  Genitourinary:  Genitourinary Comments: No CVA tenderness bilaterally  Musculoskeletal: She exhibits no edema.  Mild left flank TTP  Neurological: She is alert and oriented to person, place, and time. No cranial nerve deficit. Coordination normal.  Skin: Skin is warm and dry. No rash noted. No erythema. No pallor.  Psychiatric: She has a normal mood and affect. Her behavior is normal. Thought content normal.  Vitals reviewed.    Assessment & Plan:   1. Back pain, unspecified back location, unspecified back pain laterality, unspecified chronicity - Begin celecoxib (CELEBREX) 200 MG capsule; Take 1 capsule (200 mg total) by mouth 2 (two) times daily.  Dispense: 60 capsule; Refill: 2  2. Urinary frequency - Urinalysis Dipstick negative in clinic today.  3. GAD (generalized anxiety disorder) - Refill escitalopram (LEXAPRO) 20 MG tablet; Take 1 tablet (20 mg total) by mouth daily.  Dispense: 30 tablet; Refill: 1 - Refill clonazePAM (KLONOPIN) 0.5 MG tablet; Take 1 tablet (0.5 mg total) by mouth at bedtime.  Dispense: 21 tablet; Refill: 0   Meds ordered this encounter  Medications  . escitalopram (LEXAPRO) 20 MG tablet  Sig: Take 1 tablet (20 mg total) by mouth daily.    Dispense:  30 tablet    Refill:  1    Order Specific Question:   Supervising Provider    Answer:   Tresa Garter [id number]  . clonazePAM (KLONOPIN) 0.5 MG tablet    Sig: Take 1 tablet (0.5 mg total) by mouth at bedtime.    Dispense:  21 tablet    Refill:  0    Order Specific Question:   Supervising  Provider    Answer:   Tresa Garter [id number]  . celecoxib (CELEBREX) 200 MG capsule    Sig: Take 1 capsule (200 mg total) by mouth 2 (two) times daily.    Dispense:  60 capsule    Refill:  2    Order Specific Question:   Supervising Provider    Answer:   Tresa Garter [id number]    Follow-up: Return in about 4 weeks (around 03/22/2017) for GAD.   Clent Demark PA

## 2017-02-22 NOTE — Patient Instructions (Signed)

## 2017-02-23 ENCOUNTER — Ambulatory Visit (AMBULATORY_SURGERY_CENTER): Payer: Self-pay | Admitting: Internal Medicine

## 2017-02-23 ENCOUNTER — Encounter: Payer: Self-pay | Admitting: Internal Medicine

## 2017-02-23 VITALS — BP 122/76 | HR 62 | Temp 96.2°F | Resp 13 | Ht 74.0 in | Wt 221.0 lb

## 2017-02-23 DIAGNOSIS — K219 Gastro-esophageal reflux disease without esophagitis: Secondary | ICD-10-CM

## 2017-02-23 DIAGNOSIS — R1310 Dysphagia, unspecified: Secondary | ICD-10-CM

## 2017-02-23 MED ORDER — SODIUM CHLORIDE 0.9 % IV SOLN: 500.0000 mL | INTRAVENOUS | Status: DC

## 2017-02-23 NOTE — Patient Instructions (Signed)
YOU HAD AN ENDOSCOPIC PROCEDURE TODAY AT [facility]:   Refer to the procedure report that was given to you for any specific questions about what was found during the examination.  If the procedure report does not answer your questions, please call your gastroenterologist to clarify.  If you requested that your care partner not be given the details of your procedure findings, then the procedure report has been included in a sealed envelope for you to review at your convenience later.  YOU SHOULD EXPECT: Some feelings of bloating in the abdomen. Passage of more gas than usual.  Walking can help get rid of the air that was put into your GI tract during the procedure and reduce the bloating. If you had a lower endoscopy (such as a colonoscopy or flexible sigmoidoscopy) you may notice spotting of blood in your stool or on the toilet paper. If you underwent a bowel prep for your procedure, you may not have a normal bowel movement for a few days.  Please Note:  You might notice some irritation and congestion in your nose or some drainage.  This is from the oxygen used during your procedure.  There is no need for concern and it should clear up in a day or so.  SYMPTOMS TO REPORT IMMEDIATELY:    Following upper endoscopy (EGD)  Vomiting of blood or coffee ground material  New chest pain or pain under the shoulder blades  Painful or persistently difficult swallowing  New shortness of breath  Fever of 100F or higher  Black, tarry-looking stools  For urgent or emergent issues, a gastroenterologist can be reached at any hour by calling [phone].   DIET:  FOLLOW DILATION HANDOUT.  ACTIVITY:  You should plan to take it easy for the rest of today and you should NOT DRIVE or use heavy machinery until tomorrow (because of the sedation medicines used during the test).    FOLLOW UP: Our staff will call the number listed on your records the next business day following your procedure to  check on you and address any questions or concerns that you may have regarding the information given to you following your procedure. If we do not reach you, we will leave a message.  However, if you are feeling well and you are not experiencing any problems, there is no need to return our call.  We will assume that you have returned to your regular daily activities without incident.  If any biopsies were taken you will be contacted by phone or by letter within the next 1-3 weeks.  Please call us at [phone] if you have not heard about the biopsies in 3 weeks.    SIGNATURES/CONFIDENTIALITY: You and/or your care partner have signed paperwork which will be entered into your electronic medical record.  These signatures attest to the fact that that the information above on your After Visit Summary has been reviewed and is understood.  Full responsibility of the confidentiality of this discharge information lies with you and/or your care-partner.  Resume medications. Information given on Dilation Diet.

## 2017-02-23 NOTE — Op Note (Signed)
[facility] Patient Name: Haley Chandler Procedure Date: 02/23/2017 8:41 AM MRN: [id number] Endoscopist: Docia Chuck. Henrene Pastor , MD Age: 46 Referring MD:  Date of Birth: May 17, 1971 Gender: Female Account #: [id number] Procedure:                Upper GI endoscopy, with biopsies Indications:              Dyspepsia, Dysphagia Medicines:                Monitored Anesthesia Care Procedure:                Pre-Anesthesia Assessment:                           - Prior to the procedure, a History and Physical                            was performed, and patient medications and                            allergies were reviewed. The patient's tolerance of                            previous anesthesia was also reviewed. The risks                            and benefits of the procedure and the sedation                            options and risks were discussed with the patient.                            All questions were answered, and informed consent                            was obtained. Prior Anticoagulants: The patient has                            taken no previous anticoagulant or antiplatelet                            agents. ASA Grade Assessment: II - A patient with                            mild systemic disease. After reviewing the risks                            and benefits, the patient was deemed in                            satisfactory condition to undergo the procedure.                           After obtaining informed consent, the endoscope was  passed under direct vision. Throughout the                            procedure, the patient's blood pressure, pulse, and                            oxygen saturations were monitored continuously. The                            Model GIF-[id number]) scope was introduced                            through the mouth, and advanced to the second part                            of duodenum. The upper  GI endoscopy was                            accomplished without difficulty. The patient                            tolerated the procedure well. Scope In: Scope Out: Findings:                 The esophagus was normal. The scope was withdrawn.                            Dilation was performed with a Maloney dilator with                            no resistance at 60 Fr.                           The stomach was normal. Biopsies were taken with a                            cold forceps for Helicobacter pylori testing using                            CLOtest.                           The examined duodenum was normal.                           The cardia and gastric fundus were normal on                            retroflexion. Complications:            No immediate complications. Estimated Blood Loss:     Estimated blood loss: none. Impression:               - Normal esophagus. Dilated.                           - Normal stomach.  Biopsied.                           - Normal examined duodenum. Recommendation:           - Patient has a contact number available for                            emergencies. The signs and symptoms of potential                            delayed complications were discussed with the                            patient. Return to normal activities tomorrow.                            Written discharge instructions were provided to the                            patient.                           - Resume previous diet.                           - Continue present medications.                           - Return to the care of your doctor Docia Chuck. Henrene Pastor, MD 02/23/2017 9:02:07 AM This report has been signed electronically.

## 2017-02-23 NOTE — Progress Notes (Signed)
Spontaneous respirations throughout. VSS. Resting comfortably. To PACU on room air. Report to  RN. 

## 2017-02-23 NOTE — Progress Notes (Signed)
Pt's states no medical or surgical changes since previsit or office visit. 

## 2017-02-23 NOTE — Progress Notes (Signed)
Called to room to assist during endoscopic procedure.  Patient ID and intended procedure confirmed with present staff. Received instructions for my participation in the procedure from the performing physician.  

## 2017-02-24 ENCOUNTER — Telehealth: Payer: Self-pay | Admitting: *Deleted

## 2017-02-24 LAB — HELICOBACTER PYLORI SCREEN-BIOPSY: UREASE: NEGATIVE

## 2017-02-24 NOTE — Telephone Encounter (Signed)
  Follow up Call-  Call back number 02/23/2017  Post procedure Call Back phone  # [phone]  Permission to leave phone message No  comments No answering machine set up     Patient questions:  Do you have a fever, pain , or abdominal swelling? No. Pain Score  0 *  Have you tolerated food without any problems? Yes.    Have you been able to return to your normal activities? Yes.    Do you have any questions about your discharge instructions: Diet   No. Medications  No. Follow up visit  No.  Do you have questions or concerns about your Care? No.  Actions: * If pain score is 4 or above: No action needed, pain <4.

## 2017-03-05 MED FILL — DEXILANT DR 60 MG CAPSULE: 60 | 30 days supply | Qty: 30 | Fill #0

## 2017-04-07 ENCOUNTER — Ambulatory Visit (INDEPENDENT_AMBULATORY_CARE_PROVIDER_SITE_OTHER): Payer: Self-pay | Admitting: Physician Assistant

## 2017-04-07 ENCOUNTER — Encounter (INDEPENDENT_AMBULATORY_CARE_PROVIDER_SITE_OTHER): Payer: Self-pay | Admitting: Physician Assistant

## 2017-04-07 VITALS — BP 117/79 | HR 60 | Temp 97.5°F | Wt 220.4 lb

## 2017-04-07 DIAGNOSIS — K219 Gastro-esophageal reflux disease without esophagitis: Secondary | ICD-10-CM

## 2017-04-07 DIAGNOSIS — R351 Nocturia: Secondary | ICD-10-CM

## 2017-04-07 DIAGNOSIS — R0982 Postnasal drip: Secondary | ICD-10-CM

## 2017-04-07 DIAGNOSIS — R202 Paresthesia of skin: Secondary | ICD-10-CM

## 2017-04-07 DIAGNOSIS — R070 Pain in throat: Secondary | ICD-10-CM

## 2017-04-07 DIAGNOSIS — F411 Generalized anxiety disorder: Secondary | ICD-10-CM

## 2017-04-07 DIAGNOSIS — R0981 Nasal congestion: Secondary | ICD-10-CM

## 2017-04-07 LAB — POCT GLYCOSYLATED HEMOGLOBIN (HGB A1C): Hemoglobin A1C: 5.4

## 2017-04-07 MED ORDER — FLUTICASONE PROPIONATE 50 MCG/ACT NA SUSP: 2.0000 | Freq: Every day | NASAL | 6 refills | Status: DC

## 2017-04-07 MED ORDER — DEXLANSOPRAZOLE 60 MG PO CPDR: 60.0000 mg | DELAYED_RELEASE_CAPSULE | Freq: Every day | ORAL | 6 refills | Status: DC

## 2017-04-07 MED ORDER — PHENYLEPHRINE-DM-GG-APAP 5-10-200-325 MG/10ML PO LIQD: 20.0000 mL | ORAL | 0 refills | Status: AC

## 2017-04-07 MED FILL — FLUTICASONE PROP 50 MCG SPR: 50 | 30 days supply | Qty: 16 | Fill #0

## 2017-04-07 NOTE — Progress Notes (Signed)
Pt complains of congestion, she is unable to cough up mucus Pt complains of burning feet, wants to check sugar again  Pt wants to inquire on endoscopy results Pt complains of excessive urination at night

## 2017-04-07 NOTE — Progress Notes (Signed)
Subjective:  Patient ID: Haley Chandler, female    DOB: [birth year]  Age: 46 y.o. MRN: [id number]  CC: multiple complaints  HPI  Haley Chandler is a 47 y.o. female with a PMH of cholecystectomy and GAD presents with concern for a feeling of mucus "stuck" in the throat. She is unable to clear mucus and has a constant sensation of mucus. Issue has been chronic for many months. Symptoms worse when laying supine in bed during the night. Also has sinus congestion. Does not feel ill. Has not taken anything for relief.    Requests results of her upper endoscopy and biopsy. Upper endoscopy done 02/23/17 with completely normal results. H pylori biopsy was negative. Patient feels well on Dexilant. She was able to find a coupon and received Dexilant at a lower price.    Also complains of "burning" sensation of the soles of feet. Wants her sugar checked again for indication of diabetes.     Is not taking Escitalopram or Clonazepam for her anxiety. Says she does not have any anxiety issues.    Outpatient Medications Prior to Visit  Medication Sig Dispense Refill  . dexlansoprazole (DEXILANT) 60 MG capsule Take 1 capsule (60 mg total) by mouth daily. 30 capsule 6  . celecoxib (CELEBREX) 200 MG capsule Take 1 capsule (200 mg total) by mouth 2 (two) times daily. (Patient not taking: Reported on 04/07/2017) 60 capsule 2  . clonazePAM (KLONOPIN) 0.5 MG tablet Take 1 tablet (0.5 mg total) by mouth at bedtime. (Patient not taking: Reported on 04/07/2017) 21 tablet 0  . escitalopram (LEXAPRO) 20 MG tablet Take 1 tablet (20 mg total) by mouth daily. (Patient not taking: Reported on 02/23/2017) 30 tablet 1  . lansoprazole (PREVACID) 30 MG capsule Take 1 capsule (30 mg total) by mouth daily at 12 noon. (Patient not taking: Reported on 02/23/2017) 30 capsule 3   Facility-Administered Medications Prior to Visit  Medication Dose Route Frequency Provider Last Rate Last Dose  . 0.9 %  sodium chloride infusion  500 mL Intravenous  Continuous Irene Shipper, MD         ROS Review of Systems  Constitutional: Negative for chills, fever and malaise/fatigue.  HENT: Positive for congestion. Negative for ear pain, sinus pain and sore throat.   Eyes: Negative for blurred vision and pain.  Respiratory: Negative for cough and shortness of breath.   Cardiovascular: Negative for chest pain and palpitations.  Gastrointestinal: Negative for abdominal pain and nausea.  Genitourinary: Negative for dysuria and hematuria.  Musculoskeletal: Negative for joint pain and myalgias.  Skin: Negative for rash.  Neurological: Negative for tingling and headaches.       Burning feet  Psychiatric/Behavioral: Negative for depression. The patient is not nervous/anxious.     Objective:  BP 117/79 (BP Location: Left Arm, Patient Position: Sitting, Cuff Size: Large)   Pulse 60   Temp (!) 97.5 F (36.4 C) (Oral)   Wt 220 lb 6.4 oz (100 kg)   LMP 03/24/2017 (Approximate)   SpO2 100%   BMI 28.30 kg/m   BP/Weight 04/07/2017 02/23/2017 7/74/1287  Systolic BP 867 672 094  Diastolic BP 79 76 80  Wt. (Lbs) 220.4 221 223.6  BMI 28.3 28.37 28.71      Physical Exam  Constitutional: She is oriented to person, place, and time.  Well developed, well nourished, NAD, polite  HENT:  Head: Normocephalic and atraumatic.  Mouth/Throat: No oropharyngeal exudate.  Mild postnasal drip. Turbinates erythematous and hypertrophic.  Eyes: No  scleral icterus.  Neck: Normal range of motion. Neck supple. No thyromegaly present.  Cardiovascular: Normal rate, regular rhythm and normal heart sounds.   Pulmonary/Chest: Effort normal and breath sounds normal. No respiratory distress. She has no wheezes. She has no rales.  Musculoskeletal: She exhibits no edema.  Neurological: She is alert and oriented to person, place, and time.  Skin: Skin is warm and dry. No rash noted. No erythema. No pallor.  No sign of tinea pedis.  Psychiatric: She has a normal mood and  affect. Her behavior is normal. Thought content normal.  Vitals reviewed.    Assessment & Plan:    1. Post-nasal drip - Begin Phenylephrine-DM-GG-APAP (MUCINEX FAST-MAX COLD FLU) 5-10-200-325 MG/10ML LIQD; Take 20 mLs by mouth every 4 (four) hours.  Dispense: 1 Bottle; Refill: 0 - Begin fluticasone (FLONASE) 50 MCG/ACT nasal spray; Place 2 sprays into both nostrils daily.  Dispense: 16 g; Refill: 6 - Negative Allergen Zone 2 studies  2. Throat discomfort - Begin Phenylephrine-DM-GG-APAP (MUCINEX FAST-MAX COLD FLU) 5-10-200-325 MG/10ML LIQD; Take 20 mLs by mouth every 4 (four) hours.  Dispense: 1 Bottle; Refill: 0  3. Sinus congestion -Begin Phenylephrine-DM-GG-APAP (MUCINEX FAST-MAX COLD FLU) 5-10-200-325 MG/10ML LIQD; Take 20 mLs by mouth every 4 (four) hours.  Dispense: 1 Bottle; Refill: 0 -Begin fluticasone (FLONASE) 50 MCG/ACT nasal spray; Place 2 sprays into both nostrils daily.  Dispense: 16 g; Refill: 6 - CBC with Differential  4. Gastroesophageal reflux disease without esophagitis - Refill dexlansoprazole (DEXILANT) 60 MG capsule; Take 1 capsule (60 mg total) by mouth daily.  Dispense: 30 capsule; Refill: 6  5. Nocturia - Vitamin B12 - CBC with Differential - Comprehensive metabolic panel  6. Paresthesia - HgB A1c 5.4% in clinic today.  - Vitamin B12 - CBC with Differential - Comprehensive metabolic panel  7. Generalized anxiety disorder - Stop escitalopram and Clonazepam. Patient noncompliant and does not believe she has anxiety. Declines referral to psychiatry.   Meds ordered this encounter  Medications  . Phenylephrine-DM-GG-APAP (MUCINEX FAST-MAX COLD FLU) 5-10-200-325 MG/10ML LIQD    Sig: Take 20 mLs by mouth every 4 (four) hours.    Dispense:  1 Bottle    Refill:  0    Order Specific Question:   Supervising Provider    Answer:   Tresa Garter [id number]  . fluticasone (FLONASE) 50 MCG/ACT nasal spray    Sig: Place 2 sprays into both nostrils daily.     Dispense:  16 g    Refill:  6    Order Specific Question:   Supervising Provider    Answer:   Tresa Garter [id number]  . dexlansoprazole (DEXILANT) 60 MG capsule    Sig: Take 1 capsule (60 mg total) by mouth daily.    Dispense:  30 capsule    Refill:  6    Order Specific Question:   Supervising Provider    Answer:   Tresa Garter [id number]    Follow-up: Return if symptoms worsen or fail to improve.   Clent Demark PA

## 2017-04-07 NOTE — Patient Instructions (Signed)

## 2017-04-08 ENCOUNTER — Telehealth (INDEPENDENT_AMBULATORY_CARE_PROVIDER_SITE_OTHER): Payer: Self-pay

## 2017-04-08 ENCOUNTER — Other Ambulatory Visit (INDEPENDENT_AMBULATORY_CARE_PROVIDER_SITE_OTHER): Payer: Self-pay | Admitting: Physician Assistant

## 2017-04-08 DIAGNOSIS — G629 Polyneuropathy, unspecified: Secondary | ICD-10-CM

## 2017-04-08 LAB — CBC WITH DIFFERENTIAL/PLATELET
BASOS: 0 %
Basophils Absolute: 0 10*3/uL (ref 0.0–0.2)
EOS (ABSOLUTE): 0 10*3/uL (ref 0.0–0.4)
Eos: 1 %
HEMOGLOBIN: 12.9 g/dL (ref 11.1–15.9)
Hematocrit: 38.2 % (ref 34.0–46.6)
IMMATURE GRANS (ABS): 0 10*3/uL (ref 0.0–0.1)
Immature Granulocytes: 0 %
LYMPHS: 47 %
Lymphocytes Absolute: 1.7 10*3/uL (ref 0.7–3.1)
MCH: 30.6 pg (ref 26.6–33.0)
MCHC: 33.8 g/dL (ref 31.5–35.7)
MCV: 91 fL (ref 79–97)
Monocytes Absolute: 0.2 10*3/uL (ref 0.1–0.9)
Monocytes: 6 %
NEUTROS ABS: 1.6 10*3/uL (ref 1.4–7.0)
Neutrophils: 46 %
PLATELETS: 400 10*3/uL — AB (ref 150–379)
RBC: 4.22 x10E6/uL (ref 3.77–5.28)
RDW: 13.6 % (ref 12.3–15.4)
WBC: 3.5 10*3/uL (ref 3.4–10.8)

## 2017-04-08 LAB — COMPREHENSIVE METABOLIC PANEL
ALT: 18 IU/L (ref 0–32)
AST: 16 IU/L (ref 0–40)
Albumin/Globulin Ratio: 1.5 (ref 1.2–2.2)
Albumin: 4.5 g/dL (ref 3.5–5.5)
Alkaline Phosphatase: 36 IU/L — ABNORMAL LOW (ref 39–117)
BUN/Creatinine Ratio: 10 (ref 9–23)
BUN: 9 mg/dL (ref 6–24)
Bilirubin Total: 1 mg/dL (ref 0.0–1.2)
CALCIUM: 9.6 mg/dL (ref 8.7–10.2)
CO2: 22 mmol/L (ref 20–29)
CREATININE: 0.93 mg/dL (ref 0.57–1.00)
Chloride: 102 mmol/L (ref 96–106)
GFR, EST AFRICAN AMERICAN: 85 mL/min/{1.73_m2} (ref >59)
GFR, EST NON AFRICAN AMERICAN: 73 mL/min/{1.73_m2} (ref >59)
GLUCOSE: 87 mg/dL (ref 65–99)
Globulin, Total: 3 g/dL (ref 1.5–4.5)
Potassium: 4.1 mmol/L (ref 3.5–5.2)
Sodium: 136 mmol/L (ref 134–144)
TOTAL PROTEIN: 7.5 g/dL (ref 6.0–8.5)

## 2017-04-08 LAB — VITAMIN B12: Vitamin B-12: 933 pg/mL (ref 232–1245)

## 2017-04-08 NOTE — Telephone Encounter (Signed)
-----   Message from Clent Demark, PA-C sent at 04/08/2017  9:12 AM EDT ----- Vitamin B12 is normal. Please call pt and let her know of this result and if she would return for lab draw of ANA w/reflex. If ANA negative, I will send her to neurology for her burning feet.

## 2017-04-08 NOTE — Telephone Encounter (Signed)
Left patient a detailed message of lab results, and informed to call office with any questions and to schedule lab only visit. Nat Christen, CMA

## 2017-04-12 MED FILL — DEXILANT DR 60 MG CAPSULE: 60 | 30 days supply | Qty: 30 | Fill #0

## 2017-05-12 ENCOUNTER — Ambulatory Visit (INDEPENDENT_AMBULATORY_CARE_PROVIDER_SITE_OTHER): Payer: Self-pay | Admitting: Physician Assistant

## 2017-05-12 ENCOUNTER — Encounter (INDEPENDENT_AMBULATORY_CARE_PROVIDER_SITE_OTHER): Payer: Self-pay | Admitting: Physician Assistant

## 2017-05-12 VITALS — BP 129/85 | HR 68 | Temp 97.4°F | Wt 225.4 lb

## 2017-05-12 DIAGNOSIS — B009 Herpesviral infection, unspecified: Secondary | ICD-10-CM

## 2017-05-12 DIAGNOSIS — Z760 Encounter for issue of repeat prescription: Secondary | ICD-10-CM

## 2017-05-12 DIAGNOSIS — R0981 Nasal congestion: Secondary | ICD-10-CM

## 2017-05-12 DIAGNOSIS — Z23 Encounter for immunization: Secondary | ICD-10-CM

## 2017-05-12 DIAGNOSIS — N921 Excessive and frequent menstruation with irregular cycle: Secondary | ICD-10-CM

## 2017-05-12 DIAGNOSIS — Z1239 Encounter for other screening for malignant neoplasm of breast: Secondary | ICD-10-CM

## 2017-05-12 DIAGNOSIS — F411 Generalized anxiety disorder: Secondary | ICD-10-CM

## 2017-05-12 DIAGNOSIS — Z1231 Encounter for screening mammogram for malignant neoplasm of breast: Secondary | ICD-10-CM

## 2017-05-12 DIAGNOSIS — L989 Disorder of the skin and subcutaneous tissue, unspecified: Secondary | ICD-10-CM

## 2017-05-12 LAB — POCT URINE PREGNANCY: PREG TEST UR: NEGATIVE

## 2017-05-12 MED ORDER — VALACYCLOVIR HCL 1 G PO TABS: 1000.0000 mg | ORAL_TABLET | Freq: Every day | ORAL | 1 refills | Status: AC

## 2017-05-12 MED ORDER — PHENYLEPHRINE-DM-GG-APAP 5-10-200-325 MG/10ML PO LIQD: 20.0000 mL | ORAL | 0 refills | Status: AC

## 2017-05-12 MED ORDER — DEXLANSOPRAZOLE 60 MG PO CPDR: 60.0000 mg | DELAYED_RELEASE_CAPSULE | Freq: Every day | ORAL | 6 refills | Status: DC

## 2017-05-12 NOTE — Patient Instructions (Signed)
Td Vaccine (Tetanus and Diphtheria): What You Need to Know 1. Why get vaccinated? Tetanus  and diphtheria are very serious diseases. They are rare in the [place] today, but people who do become infected often have severe complications. Td vaccine is used to protect adolescents and adults from both of these diseases. Both tetanus and diphtheria are infections caused by bacteria. Diphtheria spreads from person to person through coughing or sneezing. Tetanus-causing bacteria enter the body through cuts, scratches, or wounds. TETANUS (lockjaw) causes painful muscle tightening and stiffness, usually all over the body.  It can lead to tightening of muscles in the head and neck so you can't open your mouth, swallow, or sometimes even breathe. Tetanus kills about 1 out of every 10 people who are infected even after receiving the best medical care.  DIPHTHERIA can cause a thick coating to form in the back of the throat.  It can lead to breathing problems, paralysis, heart failure, and death.  Before vaccines, as many as 200,000 cases of diphtheria and hundreds of cases of tetanus were reported in the [place] each year. Since vaccination began, reports of cases for both diseases have dropped by about 99%. 2. Td vaccine Td vaccine can protect adolescents and adults from tetanus and diphtheria. Td is usually given as a booster dose every 10 years but it can also be given earlier after a severe and dirty wound or burn. Another vaccine, called Tdap, which protects against pertussis in addition to tetanus and diphtheria, is sometimes recommended instead of Td vaccine. Your doctor or the person giving you the vaccine can give you more information. Td may safely be given at the same time as other vaccines. 3. Some people should not get this vaccine  A person who has ever had a life-threatening allergic reaction after a previous dose of any tetanus or diphtheria containing vaccine, OR has a severe  allergy to any part of this vaccine, should not get Td vaccine. Tell the person giving the vaccine about any severe allergies.  Talk to your doctor if you: ? had severe pain or swelling after any vaccine containing diphtheria or tetanus, ? ever had a condition called Guillain Barre Syndrome (GBS), ? aren't feeling well on the day the shot is scheduled. 4. What are the risks from Td vaccine? With any medicine, including vaccines, there is a chance of side effects. These are usually mild and go away on their own. Serious reactions are also possible but are rare. Most people who get Td vaccine do not have any problems with it. Mild problems following Td vaccine: (Did not interfere with activities)  Pain where the shot was given (about 8 people in 10)  Redness or swelling where the shot was given (about 1 person in 4)  Mild fever (rare)  Headache (about 1 person in 4)  Tiredness (about 1 person in 4)  Moderate problems following Td vaccine: (Interfered with activities, but did not require medical attention)  Fever over 102F (rare)  Severe problems following Td vaccine: (Unable to perform usual activities; required medical attention)  Swelling, severe pain, bleeding and/or redness in the arm where the shot was given (rare).  Problems that could happen after any vaccine:  People sometimes faint after a medical procedure, including vaccination. Sitting or lying down for about 15 minutes can help prevent fainting, and injuries caused by a fall. Tell your doctor if you feel dizzy, or have vision changes or ringing in the ears.  Some people get   severe pain in the shoulder and have difficulty moving the arm where a shot was given. This happens very rarely.  Any medication can cause a severe allergic reaction. Such reactions from a vaccine are very rare, estimated at fewer than 1 in a million doses, and would happen within a few minutes to a few hours after the vaccination. As with any  medicine, there is a very remote chance of a vaccine causing a serious injury or death. The safety of vaccines is always being monitored. For more information, visit: www.cdc.gov/vaccinesafety/ 5. What if there is a serious reaction? What should I look for? Look for anything that concerns you, such as signs of a severe allergic reaction, very high fever, or unusual behavior. Signs of a severe allergic reaction can include hives, swelling of the face and throat, difficulty breathing, a fast heartbeat, dizziness, and weakness. These would usually start a few minutes to a few hours after the vaccination. What should I do?  If you think it is a severe allergic reaction or other emergency that can't wait, call 9-1-1 or get the person to the nearest hospital. Otherwise, call your doctor.  Afterward, the reaction should be reported to the Vaccine Adverse Event Reporting System (VAERS). Your doctor might file this report, or you can do it yourself through the VAERS web site at www.vaers.hhs.gov, or by calling 1-800-822-7967. ? VAERS does not give medical advice. 6. The National Vaccine Injury Compensation Program The National Vaccine Injury Compensation Program (VICP) is a federal program that was created to compensate people who may have been injured by certain vaccines. Persons who believe they may have been injured by a vaccine can learn about the program and about filing a claim by calling 1-800-338-2382 or visiting the VICP website at www.hrsa.gov/vaccinecompensation. There is a time limit to file a claim for compensation. 7. How can I learn more?  Ask your doctor. He or she can give you the vaccine package insert or suggest other sources of information.  Call your local or state health department.  Contact the Centers for Disease Control and Prevention (CDC): ? Call 1-800-232-4636 (1-800-CDC-INFO) ? Visit CDC's website at www.cdc.gov/vaccines CDC Td Vaccine VIS (10/22/15) This information is  not intended to replace advice given to you by your health care provider. Make sure you discuss any questions you have with your health care provider. Document Released: 04/26/2006 Document Revised: 03/19/2016 Document Reviewed: 03/19/2016 Elsevier Interactive Patient Education  2017 Elsevier Inc. Influenza Virus Vaccine injection (Fluarix) What is this medicine? INFLUENZA VIRUS VACCINE (in floo EN zuh VAHY ruhs vak SEEN) helps to reduce the risk of getting influenza also known as the flu. This medicine may be used for other purposes; ask your health care provider or pharmacist if you have questions. COMMON BRAND NAME(S): Fluarix, Fluzone What should I tell my health care provider before I take this medicine? They need to know if you have any of these conditions: -bleeding disorder like hemophilia -fever or infection -Guillain-Barre syndrome or other neurological problems -immune system problems -infection with the human immunodeficiency virus (HIV) or AIDS -low blood platelet counts -multiple sclerosis -an unusual or allergic reaction to influenza virus vaccine, eggs, chicken proteins, latex, gentamicin, other medicines, foods, dyes or preservatives -pregnant or trying to get pregnant -breast-feeding How should I use this medicine? This vaccine is for injection into a muscle. It is given by a health care professional. A copy of Vaccine Information Statements will be given before each vaccination. Read this sheet carefully each   time. The sheet may change frequently. Talk to your pediatrician regarding the use of this medicine in children. Special care may be needed. Overdosage: If you think you have taken too much of this medicine contact a poison control center or emergency room at once. NOTE: This medicine is only for you. Do not share this medicine with others. What if I miss a dose? This does not apply. What may interact with this medicine? -chemotherapy or radiation  therapy -medicines that lower your immune system like etanercept, anakinra, infliximab, and adalimumab -medicines that treat or prevent blood clots like warfarin -phenytoin -steroid medicines like prednisone or cortisone -theophylline -vaccines This list may not describe all possible interactions. Give your health care provider a list of all the medicines, herbs, non-prescription drugs, or dietary supplements you use. Also tell them if you smoke, drink alcohol, or use illegal drugs. Some items may interact with your medicine. What should I watch for while using this medicine? Report any side effects that do not go away within 3 days to your doctor or health care professional. Call your health care provider if any unusual symptoms occur within 6 weeks of receiving this vaccine. You may still catch the flu, but the illness is not usually as bad. You cannot get the flu from the vaccine. The vaccine will not protect against colds or other illnesses that may cause fever. The vaccine is needed every year. What side effects may I notice from receiving this medicine? Side effects that you should report to your doctor or health care professional as soon as possible: -allergic reactions like skin rash, itching or hives, swelling of the face, lips, or tongue Side effects that usually do not require medical attention (report to your doctor or health care professional if they continue or are bothersome): -fever -headache -muscle aches and pains -pain, tenderness, redness, or swelling at site where injected -weak or tired This list may not describe all possible side effects. Call your doctor for medical advice about side effects. You may report side effects to FDA at 1-800-FDA-1088. Where should I keep my medicine? This vaccine is only given in a clinic, pharmacy, doctor's office, or other health care setting and will not be stored at home. NOTE: This sheet is a summary. It may not cover all possible  information. If you have questions about this medicine, talk to your doctor, pharmacist, or health care provider.  2018 Elsevier/Gold Standard (2008-01-25 09:30:40)  

## 2017-05-12 NOTE — Progress Notes (Signed)
Subjective:  Patient ID: Haley Chandler, female    DOB: 08-20-1970  Age: 46 y.o. MRN: [id number]  CC: SOB, can't sleep  HPI  Haley Chandler a 46 y.o.femalewith a PMH of cholecystectomy and GAD presents with complaint of SOB and insomnia. Symptoms are attributed to nasal congestion and are mostly present at night. Does not feel short of breath as much as having a sensation of obstruction in her nose and throat. Nasal congestion is interfering with her sleep. Says she has to completely cover herself with the exception of her airways for her to sleep better. Does not have air purification at home or in her room. No pets. Had taken Mucinex Fast Max with significant reduction in symptoms. Does not endorse f/c/n/v, sore throat, CP, HA, rash, abdominal pain, or GI/GU sxs.     Patient would like a prescription of acyclovir. Was prescribed acyclovir in [place] for her genital herpes. Reports an outbreak approximately once per year.     Of note, patient is diagnosed with GAD but was noncompliant with escitalopram and Clonazepam. She stopped because she does not believe to have anxiety issues.    Complains of abnormal  Outpatient Medications Prior to Visit  Medication Sig Dispense Refill  . dexlansoprazole (DEXILANT) 60 MG capsule Take 1 capsule (60 mg total) by mouth daily. 30 capsule 6  . fluticasone (FLONASE) 50 MCG/ACT nasal spray Place 2 sprays into both nostrils daily. 16 g 6   Facility-Administered Medications Prior to Visit  Medication Dose Route Frequency Provider Last Rate Last Dose  . 0.9 %  sodium chloride infusion  500 mL Intravenous Continuous Irene Shipper, MD         ROS Review of Systems  Constitutional: Negative for chills, fever and malaise/fatigue.  HENT: Positive for congestion. Negative for ear pain, sinus pain and sore throat.   Eyes: Negative for blurred vision and pain.  Respiratory: Positive for shortness of breath.   Cardiovascular: Negative for chest pain and  palpitations.  Gastrointestinal: Negative for abdominal pain and nausea.  Genitourinary: Negative for dysuria and hematuria.  Musculoskeletal: Negative for joint pain and myalgias.  Skin: Negative for rash.  Neurological: Negative for tingling and headaches.  Psychiatric/Behavioral: Negative for depression. The patient has insomnia. The patient is not nervous/anxious.     Objective:  Wt 225 lb 6.4 oz (102.2 kg)   BMI 28.94 kg/m   BP/Weight 05/12/2017 04/07/2017 2/70/6237  Systolic BP - 628 315  Diastolic BP - 79 76  Wt. (Lbs) 225.4 220.4 221  BMI 28.94 28.3 28.37      Physical Exam  Constitutional: She is oriented to person, place, and time.  Well developed, well nourished, NAD, polite, well groomed  HENT:  Head: Normocephalic and atraumatic.  Mouth/Throat: No oropharyngeal exudate.  Postnasal drip  Eyes: No scleral icterus.  Neck: Normal range of motion. Neck supple. No thyromegaly present.  Cardiovascular: Normal rate, regular rhythm and normal heart sounds.   Pulmonary/Chest: Effort normal and breath sounds normal. No respiratory distress. She has no wheezes.  Abdominal: Soft. Bowel sounds are normal. There is no tenderness.  Musculoskeletal: She exhibits no edema.  Lymphadenopathy:    She has no cervical adenopathy.  Neurological: She is alert and oriented to person, place, and time. No cranial nerve deficit. Coordination normal.  Skin: Skin is warm and dry. No rash noted. No erythema. No pallor.  Small, excoriated, nevus on the left lower back.   Psychiatric: She has a normal mood and affect. Her  behavior is normal. Thought content normal.  Vitals reviewed.    Assessment & Plan:    1. Nasal congestion - Begin Phenylephrine-DM-GG-APAP (MUCINEX FAST-MAX COLD FLU) 5-10-200-325 MG/10ML LIQD; Take 20 mLs by mouth every 4 (four) hours.  Dispense: 1 Bottle; Refill: 0  2. HSV-2 infection - Begin valACYclovir (VALTREX) 1000 MG tablet; Take 1 tablet (1,000 mg total) by  mouth daily.  Dispense: 5 tablet; Refill: 1  3. Metrorrhagia - One episode. Advised to observe and annotate over the following two months. It may also be likely she is entering into perimenopause. Consider birth control for regulation of menstrual period if mammogram is normal.  - uHCG negative in clinic today.  4. Need for Tdap vaccination - Tdap vaccine greater than or equal to 7yo IM  5. Need for prophylactic vaccination and inoculation against influenza - Flu Vaccine QUAD 6+ mos PF IM (Fluarix Quad PF)  6. Screening for breast cancer - [place] DIGITAL SCREENING BILATERAL; Future - POCT urine pregnancy  7. Skin lesion - Ambulatory referral to Dermatology  8. GAD (generalized anxiety disorder) - Pt does not believe she has anxiety. Refuses to take SSRI and Benzodiazepine.  9. Medication refill - Dexilant 60 mg capsule   Meds ordered this encounter  Medications  . valACYclovir (VALTREX) 1000 MG tablet    Sig: Take 1 tablet (1,000 mg total) by mouth daily.    Dispense:  5 tablet    Refill:  1    Order Specific Question:   Supervising Provider    Answer:   Tresa Garter [id number]  . Phenylephrine-DM-GG-APAP (MUCINEX FAST-MAX COLD FLU) 5-10-200-325 MG/10ML LIQD    Sig: Take 20 mLs by mouth every 4 (four) hours.    Dispense:  1 Bottle    Refill:  0    Order Specific Question:   Supervising Provider    Answer:   Tresa Garter [id number]    Follow-up: Return in about 2 weeks (around 05/26/2017) for PAP only.   Clent Demark PA

## 2017-05-14 MED FILL — ?VALACYCLOVIR HCL 1 GRAM TA: 1 | 5 days supply | Qty: 5 | Fill #0

## 2017-05-26 ENCOUNTER — Other Ambulatory Visit (HOSPITAL_COMMUNITY)
Admission: RE | Admit: 2017-05-26 | Discharge: 2017-05-26 | Disposition: A | Discharge status: Home / Self Care | Payer: Self-pay | Source: Ambulatory Visit | Attending: Physician Assistant | Admitting: Physician Assistant

## 2017-05-26 ENCOUNTER — Ambulatory Visit (INDEPENDENT_AMBULATORY_CARE_PROVIDER_SITE_OTHER): Payer: Self-pay | Admitting: Physician Assistant

## 2017-05-26 ENCOUNTER — Encounter (INDEPENDENT_AMBULATORY_CARE_PROVIDER_SITE_OTHER): Payer: Self-pay | Admitting: Physician Assistant

## 2017-05-26 ENCOUNTER — Other Ambulatory Visit: Payer: Self-pay

## 2017-05-26 VITALS — BP 124/83 | HR 66 | Temp 98.4°F | Wt 226.2 lb

## 2017-05-26 DIAGNOSIS — Z760 Encounter for issue of repeat prescription: Secondary | ICD-10-CM

## 2017-05-26 DIAGNOSIS — Z124 Encounter for screening for malignant neoplasm of cervix: Secondary | ICD-10-CM | POA: Insufficient documentation

## 2017-05-26 DIAGNOSIS — N760 Acute vaginitis: Secondary | ICD-10-CM

## 2017-05-26 DIAGNOSIS — B9689 Other specified bacterial agents as the cause of diseases classified elsewhere: Secondary | ICD-10-CM

## 2017-05-26 MED ORDER — METRONIDAZOLE 500 MG PO TABS: 500.0000 mg | ORAL_TABLET | Freq: Two times a day (BID) | ORAL | 0 refills | Status: AC

## 2017-05-26 MED ORDER — VALACYCLOVIR HCL 1 G PO TABS: 1000.0000 mg | ORAL_TABLET | Freq: Every day | ORAL | 99 refills | Status: DC

## 2017-05-26 MED FILL — ?VALACYCLOVIR HCL 1 GRAM TA: 1 | 5 days supply | Qty: 5 | Fill #0

## 2017-05-26 MED FILL — metroNIDAZOLE 500 MG TABS: 500 | 7 days supply | Qty: 14 | Fill #0

## 2017-05-26 NOTE — Patient Instructions (Signed)

## 2017-05-26 NOTE — Progress Notes (Signed)
Subjective:  Patient ID: Haley Chandler, female    DOB: 02-01-1971  Age: 46 y.o. MRN: [id number]  CC: PAP smear  HPI  Haley Chandler a 46 y.o.femalewith a PMH of cholecystectomy and GAD presents for PAP smear. Has mild vaginal discharge and occasional dyspareunia. Asks for a refill of Valcyclovir in case she gets another herpes outbreak. Does not endorse any other GU or constitutional symptoms.     Outpatient Medications Prior to Visit  Medication Sig Dispense Refill  . dexlansoprazole (DEXILANT) 60 MG capsule Take 1 capsule (60 mg total) by mouth daily. 30 capsule 6  . fluticasone (FLONASE) 50 MCG/ACT nasal spray Place 2 sprays into both nostrils daily. 16 g 6   Facility-Administered Medications Prior to Visit  Medication Dose Route Frequency Provider Last Rate Last Dose  . 0.9 %  sodium chloride infusion  500 mL Intravenous Continuous Irene Shipper, MD         ROS Review of Systems  Constitutional: Negative for chills, fever and malaise/fatigue.  Eyes: Negative for blurred vision.  Respiratory: Negative for shortness of breath.   Cardiovascular: Negative for chest pain and palpitations.  Gastrointestinal: Negative for abdominal pain and nausea.  Genitourinary: Negative for dysuria and hematuria.       Occasional dyspareunia. Vaginal discharge.   Musculoskeletal: Negative for joint pain and myalgias.  Skin: Negative for rash.  Neurological: Negative for tingling and headaches.  Psychiatric/Behavioral: Negative for depression. The patient is not nervous/anxious.     Objective:  LMP 05/08/2017 (Exact Date)   BP/Weight 05/12/2017 04/07/2017 8/41/6606  Systolic BP 301 601 093  Diastolic BP 85 79 76  Wt. (Lbs) 225.4 220.4 221  BMI 28.94 28.3 28.37      Physical Exam  Constitutional: She is oriented to person, place, and time.  Well developed, well nourished, NAD, polite  HENT:  Head: Normocephalic and atraumatic.  Eyes: No scleral icterus.  Pulmonary/Chest: Effort  normal.  Genitourinary:  Genitourinary Comments: Vagina with thick, whitish/transparent discharge. Cervix lacerated, no cervical motion tenderness. Adnexa without mass or pain bilaterally. Uterus without mass or pain.  Musculoskeletal: She exhibits no edema.  Neurological: She is alert and oriented to person, place, and time.  Skin: Skin is warm and dry. No rash noted. No erythema. No pallor.  No genital lesions  Psychiatric: She has a normal mood and affect. Her behavior is normal. Thought content normal.  Vitals reviewed.    Assessment & Plan:    1. Screening for cervical cancer - Cytology - PAP [place]  2. BV (bacterial vaginosis) - Begin Metronidazole 500 mg BID, x7 days, #14, zero refills.  3. Medication refill - Valcyclovir 1 g tablet, one tablet po qday x5 days, #5, refill PRN      Meds ordered this encounter  Medications  . metroNIDAZOLE (FLAGYL) 500 MG tablet    Sig: Take 1 tablet (500 mg total) 2 (two) times daily for 7 days by mouth.    Dispense:  14 tablet    Refill:  0    Order Specific Question:   Supervising Provider    Answer:   Tresa Garter [id number]  . valACYclovir (VALTREX) 1000 MG tablet    Sig: Take 1 tablet (1,000 mg total) daily by mouth.    Dispense:  5 tablet    Refill:  prn    Order Specific Question:   Supervising Provider    Answer:   Tresa Garter [[id number]]    Follow-up: Return if symptoms worsen  or fail to improve.   Clent Demark PA

## 2017-05-27 LAB — CYTOLOGY - PAP
Bacterial vaginitis: POSITIVE — AB
Candida vaginitis: NEGATIVE
Chlamydia: NEGATIVE
Diagnosis: NEGATIVE
Neisseria Gonorrhea: NEGATIVE
TRICH (WINDOWPATH): NEGATIVE

## 2017-05-28 ENCOUNTER — Telehealth (INDEPENDENT_AMBULATORY_CARE_PROVIDER_SITE_OTHER): Payer: Self-pay

## 2017-05-28 NOTE — Telephone Encounter (Signed)
-----   Message from Clent Demark, PA-C sent at 05/28/2017 12:49 PM EST ----- PAP negative for malignant cells, chlamydia, gonorrhea, trichomonas, and yeast. Positive for BV but I already prescribed metronidazole for BV at the visit.

## 2017-05-28 NOTE — Telephone Encounter (Signed)
Left patient a voicemail informing of negative pap. Positive BV but medication has already been prescribed and to call office with any questions. Nat Christen, CMA

## 2017-06-01 ENCOUNTER — Telehealth (INDEPENDENT_AMBULATORY_CARE_PROVIDER_SITE_OTHER): Payer: Self-pay

## 2017-06-01 LAB — CERVICOVAGINAL ANCILLARY ONLY: HERPES (WINDOWPATH): NEGATIVE

## 2017-06-01 NOTE — Telephone Encounter (Signed)
-----   Message from Clent Demark, PA-C sent at 06/01/2017 11:40 AM EST ----- Negative for HSV 1 and 2

## 2017-06-01 NOTE — Telephone Encounter (Signed)
Left patient a voicemail informing of negative HSV 1 and 2. Nat Christen, CMA

## 2017-06-18 ENCOUNTER — Telehealth (INDEPENDENT_AMBULATORY_CARE_PROVIDER_SITE_OTHER): Payer: Self-pay | Admitting: Physician Assistant

## 2017-06-18 NOTE — Telephone Encounter (Signed)
Patient called regarding her cytology test

## 2017-06-23 NOTE — Telephone Encounter (Signed)
Called patient and left results on voicemail again. Nat Christen, CMA

## 2017-07-08 ENCOUNTER — Ambulatory Visit (INDEPENDENT_AMBULATORY_CARE_PROVIDER_SITE_OTHER): Payer: Self-pay | Admitting: Physician Assistant

## 2017-07-08 ENCOUNTER — Other Ambulatory Visit: Payer: Self-pay

## 2017-07-08 ENCOUNTER — Encounter (INDEPENDENT_AMBULATORY_CARE_PROVIDER_SITE_OTHER): Payer: Self-pay | Admitting: Physician Assistant

## 2017-07-08 VITALS — BP 116/75 | HR 50 | Temp 98.3°F | Wt 227.4 lb

## 2017-07-08 DIAGNOSIS — Z72820 Sleep deprivation: Secondary | ICD-10-CM

## 2017-07-08 DIAGNOSIS — F411 Generalized anxiety disorder: Secondary | ICD-10-CM

## 2017-07-08 DIAGNOSIS — R109 Unspecified abdominal pain: Secondary | ICD-10-CM

## 2017-07-08 DIAGNOSIS — R350 Frequency of micturition: Secondary | ICD-10-CM

## 2017-07-08 LAB — POCT URINALYSIS DIPSTICK
BILIRUBIN UA: NEGATIVE
GLUCOSE UA: NEGATIVE
KETONES UA: NEGATIVE
Leukocytes, UA: NEGATIVE
Nitrite, UA: NEGATIVE
Protein, UA: NEGATIVE
Spec Grav, UA: 1.02 (ref 1.010–1.025)
UROBILINOGEN UA: 0.2 U/dL
pH, UA: 7 (ref 5.0–8.0)

## 2017-07-08 MED ORDER — ESCITALOPRAM OXALATE 10 MG PO TABS: 10.0000 mg | ORAL_TABLET | Freq: Every day | ORAL | 1 refills | Status: DC

## 2017-07-08 MED ORDER — CLONAZEPAM 0.5 MG PO TABS: 0.5000 mg | ORAL_TABLET | Freq: Two times a day (BID) | ORAL | 0 refills | Status: DC | PRN

## 2017-07-08 NOTE — Progress Notes (Signed)
Subjective:  Patient ID: Haley Chandler, female    DOB: [birth year]  Age: 46 y.o. MRN: [id number]  CC: concentration problems  HPI Haley Chandler is a 46 y.o. female with a medical history of cholecystectomy and GAD presents with "concentration problems". GAD 7 and PHQ9 scores are both 8 in clinic today. Says she has experienced tachycardia, SOB, and trouble concentrating when she drives. Has to pull over to "calm down".    Also complains of left flank pain that waxes and wanes over many months. Endorses urinary frequency and nocturia. Does not endorse dysuria, hematuria, abdominal pain, constipation, diarrhea, or f/c/n/v. Has not taken anything for relief.       Outpatient Medications Prior to Visit  Medication Sig Dispense Refill  . fluticasone (FLONASE) 50 MCG/ACT nasal spray Place 2 sprays into both nostrils daily. 16 g 6  . dexlansoprazole (DEXILANT) 60 MG capsule Take 1 capsule (60 mg total) by mouth daily. (Patient not taking: Reported on 05/26/2017) 30 capsule 6  . valACYclovir (VALTREX) 1000 MG tablet Take 1 tablet (1,000 mg total) daily by mouth. (Patient not taking: Reported on 07/08/2017) 5 tablet prn   Facility-Administered Medications Prior to Visit  Medication Dose Route Frequency Provider Last Rate Last Dose  . 0.9 %  sodium chloride infusion  500 mL Intravenous Continuous Irene Shipper, MD         ROS Review of Systems  Constitutional: Negative for chills, fever and malaise/fatigue.  Eyes: Negative for blurred vision.  Respiratory: Negative for shortness of breath.   Cardiovascular: Negative for chest pain and palpitations.  Gastrointestinal: Negative for abdominal pain and nausea.  Genitourinary: Negative for dysuria and hematuria.  Musculoskeletal: Negative for joint pain and myalgias.       Left flank pain  Skin: Negative for rash.  Neurological: Negative for tingling and headaches.  Psychiatric/Behavioral: Negative for depression. The patient is not  nervous/anxious.     Objective:  BP 116/75 (BP Location: Right Arm, Patient Position: Sitting, Cuff Size: Large)   Pulse (!) 50   Temp 98.3 F (36.8 C) (Oral)   Wt 227 lb 6.4 oz (103.1 kg)   LMP 07/02/2017 (Approximate)   SpO2 93%   BMI 29.20 kg/m   BP/Weight 07/08/2017 05/26/2017 37/16/9678  Systolic BP 938 101 751  Diastolic BP 75 83 85  Wt. (Lbs) 227.4 226.2 225.4  BMI 29.2 29.04 28.94      Physical Exam  Constitutional: She is oriented to person, place, and time.  Well developed, well nourished, NAD, polite  HENT:  Head: Normocephalic and atraumatic.  Eyes: No scleral icterus.  Neck: Normal range of motion. Neck supple. No thyromegaly present.  Cardiovascular: Normal rate, regular rhythm and normal heart sounds.  Pulmonary/Chest: Effort normal and breath sounds normal.  Abdominal: Soft. Bowel sounds are normal. There is no tenderness.  Musculoskeletal: She exhibits no edema.  Neurological: She is alert and oriented to person, place, and time.  Skin: Skin is warm and dry. No rash noted. No erythema. No pallor.  Psychiatric: Her behavior is normal. Thought content normal.  Somewhat anxious  Vitals reviewed.    Assessment & Plan:   1. Generalized anxiety disorder - Patient finally accepted a diagnosis of anxiety and is now willing to take medication.  - Begin escitalopram (LEXAPRO) 10 MG tablet; Take 1 tablet (10 mg total) by mouth daily.  Dispense: 30 tablet; Refill: 1 - Begin clonazePAM (KLONOPIN) 0.5 MG tablet; Take 1 tablet (0.5 mg total) by mouth 2 (two)  times daily as needed for anxiety.  Dispense: 30 tablet; Refill: 0  2. Poor sleep - Likely anxiety related - Begin clonazePAM (KLONOPIN) 0.5 MG tablet; Take 1 tablet (0.5 mg total) by mouth 2 (two) times daily as needed for anxiety.  Dispense: 30 tablet; Refill: 0  3. Left flank pain - Unknown etiology at this point. Will control anxiety at this point and reassess.   4. Urinary frequency - Urinalysis  Dipstick negative in clinic today   Meds ordered this encounter  Medications  . escitalopram (LEXAPRO) 10 MG tablet    Sig: Take 1 tablet (10 mg total) by mouth daily.    Dispense:  30 tablet    Refill:  1    Order Specific Question:   Supervising Provider    Answer:   Tresa Garter [id number]  . clonazePAM (KLONOPIN) 0.5 MG tablet    Sig: Take 1 tablet (0.5 mg total) by mouth 2 (two) times daily as needed for anxiety.    Dispense:  30 tablet    Refill:  0    Order Specific Question:   Supervising Provider    Answer:   Tresa Garter [id number]    Follow-up: Return in about 6 weeks (around 08/19/2017) for anxiety.   Clent Demark PA

## 2017-07-08 NOTE — Progress Notes (Signed)
Pt states that while driving she has SOB and problem concentration  Pt complains of left sided pain down to abdomen  Pt complains of waking up with swollen, teary, itchy eyes

## 2017-07-08 NOTE — Patient Instructions (Signed)

## 2017-11-03 ENCOUNTER — Other Ambulatory Visit: Payer: Self-pay

## 2017-11-03 ENCOUNTER — Ambulatory Visit (INDEPENDENT_AMBULATORY_CARE_PROVIDER_SITE_OTHER): Payer: Self-pay | Admitting: Physician Assistant

## 2017-11-03 ENCOUNTER — Encounter (INDEPENDENT_AMBULATORY_CARE_PROVIDER_SITE_OTHER): Payer: Self-pay | Admitting: Physician Assistant

## 2017-11-03 VITALS — BP 106/66 | HR 57 | Temp 98.3°F | Ht 74.0 in | Wt 215.2 lb

## 2017-11-03 DIAGNOSIS — Z9119 Patient's noncompliance with other medical treatment and regimen: Secondary | ICD-10-CM

## 2017-11-03 DIAGNOSIS — F411 Generalized anxiety disorder: Secondary | ICD-10-CM

## 2017-11-03 DIAGNOSIS — R202 Paresthesia of skin: Secondary | ICD-10-CM

## 2017-11-03 DIAGNOSIS — R0982 Postnasal drip: Secondary | ICD-10-CM

## 2017-11-03 DIAGNOSIS — R1013 Epigastric pain: Secondary | ICD-10-CM

## 2017-11-03 DIAGNOSIS — Z91199 Patient's noncompliance with other medical treatment and regimen due to unspecified reason: Secondary | ICD-10-CM

## 2017-11-03 MED ORDER — LEVOCETIRIZINE DIHYDROCHLORIDE 5 MG PO TABS: 5.0000 mg | ORAL_TABLET | Freq: Every evening | ORAL | 0 refills | Status: DC

## 2017-11-03 MED ORDER — DULOXETINE HCL 30 MG PO CPEP: 30.0000 mg | ORAL_CAPSULE | Freq: Every day | ORAL | 5 refills | Status: DC

## 2017-11-03 MED ORDER — ESOMEPRAZOLE MAGNESIUM 40 MG PO CPDR: 40.0000 mg | DELAYED_RELEASE_CAPSULE | Freq: Every day | ORAL | 3 refills | Status: DC

## 2017-11-03 MED FILL — DULoxetine HCL 30 MG CPEP: 30 | 30 days supply | Qty: 30 | Fill #0

## 2017-11-03 MED FILL — ESOMEPRAZOLE MAG DR 40 MG C: 40 | 30 days supply | Qty: 30 | Fill #1

## 2017-11-03 NOTE — Progress Notes (Signed)
Subjective:  Patient ID: Haley Chandler, female    DOB: Jan 14, 1971  Age: 47 y.o. MRN: [id number]  CC: abdominal pain  HPI Haley Chandler is a 47 y.o. female with a medical history of cholecystectomy, GERD, and GAD presents with epigastric pain that radiated to the back four days ago. Took advil, Pepto Bismal, and Nexium with relief of pain. Pain has decreased since four days ago. Previous EGD and H pylori IgG were normal/negative. Does not endorse BRBPR, melena, CP, SOB, or edema.     Also states she has persistent problem with burning sensation of the feet. Previous lab testing to include A1c, CBC, and Vit B12 were normal.      Outpatient Medications Prior to Visit  Medication Sig Dispense Refill  . fluticasone (FLONASE) 50 MCG/ACT nasal spray Place 2 sprays into both nostrils daily. 16 g 6  . dexlansoprazole (DEXILANT) 60 MG capsule Take 1 capsule (60 mg total) by mouth daily. (Patient not taking: Reported on 05/26/2017) 30 capsule 6  . clonazePAM (KLONOPIN) 0.5 MG tablet Take 1 tablet (0.5 mg total) by mouth 2 (two) times daily as needed for anxiety. 30 tablet 0  . escitalopram (LEXAPRO) 10 MG tablet Take 1 tablet (10 mg total) by mouth daily. 30 tablet 1  . valACYclovir (VALTREX) 1000 MG tablet Take 1 tablet (1,000 mg total) daily by mouth. (Patient not taking: Reported on 07/08/2017) 5 tablet prn   Facility-Administered Medications Prior to Visit  Medication Dose Route Frequency Provider Last Rate Last Dose  . 0.9 %  sodium chloride infusion  500 mL Intravenous Continuous Irene Shipper, MD         ROS Review of Systems  Constitutional: Negative for chills, fever and malaise/fatigue.  Eyes: Negative for blurred vision.  Respiratory: Negative for shortness of breath.   Cardiovascular: Negative for chest pain and palpitations.  Gastrointestinal: Positive for abdominal pain. Negative for blood in stool, melena, nausea and vomiting.  Genitourinary: Negative for dysuria, frequency,  hematuria and urgency.  Musculoskeletal: Negative for joint pain and myalgias.  Skin: Negative for rash.  Neurological: Negative for tingling and headaches.       Burning of the feet  Psychiatric/Behavioral: Negative for depression. The patient is not nervous/anxious.     Objective:  BP 106/66 (BP Location: Left Arm, Patient Position: Sitting, Cuff Size: Large)   Pulse (!) 57   Temp 98.3 F (36.8 C) (Oral)   Ht 6\' 2"  (1.88 m)   Wt 215 lb 3.2 oz (97.6 kg)   LMP 10/27/2017 (Approximate)   SpO2 100%   BMI 27.63 kg/m   BP/Weight 11/03/2017 07/08/2017 76/54/6503  Systolic BP 546 568 127  Diastolic BP 66 75 83  Wt. (Lbs) 215.2 227.4 226.2  BMI 27.63 29.2 29.04      Physical Exam  Constitutional: She is oriented to person, place, and time. She appears well-developed and well-nourished.  Non-toxic appearance. She does not appear ill. No distress.  Well developed, well nourished, NAD, polite  HENT:  Head: Normocephalic and atraumatic.  Eyes: No scleral icterus.  Neck: Normal range of motion. Neck supple. No thyromegaly present.  Cardiovascular: Normal rate, regular rhythm and normal heart sounds.  Pulmonary/Chest: Effort normal and breath sounds normal.  Abdominal: Soft. Normal appearance and bowel sounds are normal. She exhibits no ascites. There is tenderness (in all quadrant but worse in the epigastrium). There is no CVA tenderness.  Musculoskeletal: She exhibits no edema.  Neurological: She is alert and oriented to person, place,  and time.  Skin: Skin is warm and dry. No rash noted. No erythema. No pallor.  Psychiatric: Her behavior is normal. Thought content normal.  Somewhat anxious  Vitals reviewed.    Assessment & Plan:   1. Paresthesia - Begin DULoxetine (CYMBALTA) 30 MG capsule; Take 1 capsule (30 mg total) by mouth daily.  Dispense: 30 capsule; Refill: 5  2. Abdominal pain, epigastric - CBC with Differential - Comprehensive metabolic panel - Lipase -  esomeprazole (NEXIUM) 40 MG capsule; Take 1 capsule (40 mg total) by mouth daily.  Dispense: 30 capsule; Refill: 3  3. GAD (generalized anxiety disorder) - Begin DULoxetine (CYMBALTA) 30 MG capsule; Take 1 capsule (30 mg total) by mouth daily.  Dispense: 30 capsule; Refill: 5  4. Post-nasal drip - Begin levocetirizine (XYZAL) 5 MG tablet; Take 1 tablet (5 mg total) by mouth every evening.  Dispense: 30 tablet; Refill: 0  5. Noncompliance - Several visits of medication noncompliance.   Meds ordered this encounter  Medications  . DULoxetine (CYMBALTA) 30 MG capsule    Sig: Take 1 capsule (30 mg total) by mouth daily.    Dispense:  30 capsule    Refill:  5    Order Specific Question:   Supervising Provider    Answer:   Tresa Garter [id number]  . levocetirizine (XYZAL) 5 MG tablet    Sig: Take 1 tablet (5 mg total) by mouth every evening.    Dispense:  30 tablet    Refill:  0    Order Specific Question:   Supervising Provider    Answer:   Tresa Garter [id number]  . esomeprazole (NEXIUM) 40 MG capsule    Sig: Take 1 capsule (40 mg total) by mouth daily.    Dispense:  30 capsule    Refill:  3    Order Specific Question:   Supervising Provider    Answer:   Tresa Garter [id number]    Follow-up: Return if symptoms worsen or fail to improve.   Clent Demark PA

## 2017-11-03 NOTE — Patient Instructions (Signed)
Acute Pancreatitis Acute pancreatitis is a condition in which the pancreas suddenly gets irritated and swollen (has inflammation). The pancreas is a large gland behind the stomach. It makes enzymes that help to digest food. The pancreas also makes hormones that help to control your blood sugar. Acute pancreatitis happens when the enzymes attack the pancreas and damage it. Most attacks last a couple of days and can cause serious problems. Follow these instructions at home: Eating and drinking  Follow instructions from your doctor about diet. You may need to: ? Avoid alcohol. ? Limit how much fat is in your diet.  Eat small meals often. Avoid eating big meals.  Drink enough fluid to keep your pee (urine) clear or pale yellow.  Do not drink alcohol if it caused your condition. General instructions  Take over-the-counter and prescription medicines only as told by your doctor.  Do not use any tobacco products. These include cigarettes, chewing tobacco, and e-cigarettes. If you need help quitting, ask your doctor.  Get plenty of rest.  If directed, check your blood sugar at home as told by your doctor.  Keep all follow-up visits as told by your doctor. This is important. Contact a doctor if:  You do not get better as quickly as expected.  You have new symptoms.  Your symptoms get worse.  You have lasting pain or weakness.  You continue to feel sick to your stomach (nauseous).  You get better and then you have another pain attack.  You have a fever. Get help right away if:  You cannot eat or keep fluids down.  Your pain becomes very bad.  Your skin or the white part of your eyes turns yellow (jaundice).  You throw up (vomit).  You feel dizzy or you pass out (faint).  Your blood sugar is high (over 300 mg/dL). This information is not intended to replace advice given to you by your health care provider. Make sure you discuss any questions you have with your health care  provider. Document Released: 12/16/2007 Document Revised: 12/05/2015 Document Reviewed: 04/02/2015 Elsevier Interactive Patient Education  2018 [employer] Inc[employer]  

## 2017-11-04 ENCOUNTER — Telehealth (INDEPENDENT_AMBULATORY_CARE_PROVIDER_SITE_OTHER): Payer: Self-pay

## 2017-11-04 LAB — CBC WITH DIFFERENTIAL/PLATELET
BASOS: 1 %
Basophils Absolute: 0 10*3/uL (ref 0.0–0.2)
EOS (ABSOLUTE): 0 10*3/uL (ref 0.0–0.4)
Eos: 1 %
Hematocrit: 38.7 % (ref 34.0–46.6)
Hemoglobin: 12.6 g/dL (ref 11.1–15.9)
IMMATURE GRANULOCYTES: 0 %
Immature Grans (Abs): 0 10*3/uL (ref 0.0–0.1)
LYMPHS ABS: 1.7 10*3/uL (ref 0.7–3.1)
Lymphs: 39 %
MCH: 30.8 pg (ref 26.6–33.0)
MCHC: 32.6 g/dL (ref 31.5–35.7)
MCV: 95 fL (ref 79–97)
MONOS ABS: 0.4 10*3/uL (ref 0.1–0.9)
Monocytes: 9 %
NEUTROS PCT: 50 %
Neutrophils Absolute: 2.2 10*3/uL (ref 1.4–7.0)
PLATELETS: 378 10*3/uL (ref 150–379)
RBC: 4.09 x10E6/uL (ref 3.77–5.28)
RDW: 13.3 % (ref 12.3–15.4)
WBC: 4.4 10*3/uL (ref 3.4–10.8)

## 2017-11-04 LAB — COMPREHENSIVE METABOLIC PANEL
A/G RATIO: 1.4 (ref 1.2–2.2)
ALK PHOS: 36 IU/L — AB (ref 39–117)
ALT: 15 IU/L (ref 0–32)
AST: 19 IU/L (ref 0–40)
Albumin: 4.3 g/dL (ref 3.5–5.5)
BILIRUBIN TOTAL: 0.7 mg/dL (ref 0.0–1.2)
BUN/Creatinine Ratio: 13 (ref 9–23)
BUN: 11 mg/dL (ref 6–24)
CALCIUM: 9.4 mg/dL (ref 8.7–10.2)
CHLORIDE: 108 mmol/L — AB (ref 96–106)
CO2: 24 mmol/L (ref 20–29)
Creatinine, Ser: 0.86 mg/dL (ref 0.57–1.00)
GFR calc Af Amer: 93 mL/min/{1.73_m2} (ref >59)
GFR calc non Af Amer: 81 mL/min/{1.73_m2} (ref >59)
Globulin, Total: 3 g/dL (ref 1.5–4.5)
Glucose: 84 mg/dL (ref 65–99)
POTASSIUM: 4.2 mmol/L (ref 3.5–5.2)
SODIUM: 144 mmol/L (ref 134–144)
Total Protein: 7.3 g/dL (ref 6.0–8.5)

## 2017-11-04 LAB — LIPASE: LIPASE: 37 U/L (ref 14–72)

## 2017-11-04 NOTE — Telephone Encounter (Signed)
Patient is aware of normal pancreas and liver results as well as no infection or inflammation. Nat Christen, CMA

## 2017-11-04 NOTE — Telephone Encounter (Signed)
-----   Message from Clent Demark, PA-C sent at 11/04/2017  1:44 PM EDT ----- Pancreas and liver results normal. No sign of infection or inflammation.

## 2017-11-08 MED FILL — valACYclovir HCL 1 GM TABS: 1 | 5 days supply | Qty: 5 | Fill #1

## 2017-11-24 ENCOUNTER — Other Ambulatory Visit: Payer: Self-pay

## 2017-11-24 ENCOUNTER — Encounter (INDEPENDENT_AMBULATORY_CARE_PROVIDER_SITE_OTHER): Payer: Self-pay | Admitting: Nurse Practitioner

## 2017-11-24 ENCOUNTER — Ambulatory Visit (INDEPENDENT_AMBULATORY_CARE_PROVIDER_SITE_OTHER): Payer: Self-pay | Admitting: Nurse Practitioner

## 2017-11-24 VITALS — BP 111/61 | HR 62 | Temp 97.9°F | Ht 74.0 in | Wt 217.0 lb

## 2017-11-24 DIAGNOSIS — R109 Unspecified abdominal pain: Secondary | ICD-10-CM

## 2017-11-24 DIAGNOSIS — R399 Unspecified symptoms and signs involving the genitourinary system: Secondary | ICD-10-CM

## 2017-11-24 LAB — POCT URINALYSIS DIPSTICK
BILIRUBIN UA: NEGATIVE
Glucose, UA: NEGATIVE
KETONES UA: NEGATIVE
Leukocytes, UA: NEGATIVE
NITRITE UA: NEGATIVE
PH UA: 6.5 (ref 5.0–8.0)
PROTEIN UA: NEGATIVE
SPEC GRAV UA: 1.02 (ref 1.010–1.025)
UROBILINOGEN UA: 0.2 U/dL

## 2017-11-24 MED ORDER — TIZANIDINE HCL 4 MG PO CAPS: 4.0000 mg | ORAL_CAPSULE | Freq: Three times a day (TID) | ORAL | 0 refills | Status: DC

## 2017-11-24 NOTE — Patient Instructions (Signed)
Flank Pain Flank pain is pain in your side. The flank is the area of your side between your upper belly (abdomen) and your back. The pain may occur over a short period of time (acute) or may be long-term or come back often (chronic). It may be mild or very bad. Pain in this area can be caused by many different things. Follow these instructions at home:  Rest as told by your doctor.  Drink enough fluid to keep your pee (urine) clear or pale yellow.  Take over-the-counter and prescription medicines only as told by your doctor.  Keep all follow-up visits as told by your doctor. This is important. Contact a doctor if:  Medicine does not help your pain.  You have new symptoms.  Your pain gets worse.  You have a fever.  Your symptoms last longer than 2-3 days. Get help right away if:  Your tummy hurts or is swollen.  You are short of breath.  You feel sick to your stomach (nauseous) and it does not go away.  You cannot stop throwing up (vomiting).  You feel like you will pass out or you do pass out (faint).  You have blood in your pee.  You have a fever and your symptoms suddenly get worse. This information is not intended to replace advice given to you by your health care provider. Make sure you discuss any questions you have with your health care provider. Document Released: 04/07/2008 Document Revised: 03/20/2016 Document Reviewed: 04/02/2015 Elsevier Interactive Patient Education  2018 [employer] Inc.  

## 2017-11-24 NOTE — Progress Notes (Signed)
Assessment & Plan:  Haley Chandler was seen today for possible uti.  Diagnoses and all orders for this visit:  UTI symptoms -     Urinalysis Dipstick  Left flank pain -     tiZANidine (ZANAFLEX) 4 MG capsule; Take 1 capsule (4 mg total) by mouth 3 (three) times daily. May also apply heat to the affected area to .  Patient has been counseled on age-appropriate routine health concerns for screening and prevention. These are reviewed and up-to-date. Referrals have been placed accordingly. Immunizations are up-to-date or declined.    Subjective:   Chief Complaint  Patient presents with  . possible UTI   HPI Haley Chandler 47 y.o. female presents to office today with complaints of UTI.   Urinary Tract Infection Patient complains of urinary frequency, left flank pain, left lower pelvic pain and left leg pain She reports symptoms have been ongoing and she has discussed this with her PCP in the past and each time her UA is negative. Patient also complains of chills. Patient denies hematuria, nausea/vomiting, back pain, fever and vaginal discharge. Patient does not have a history of recurrent UTI.  Patient does not have a history of pyelonephritis. She has not tried application of heat to the affected areas.  She describes the pain as aching pain. Duration of pain: Hours.  530  Review of Systems  Constitutional: Negative for fever, malaise/fatigue and weight loss.  HENT: Negative.  Negative for nosebleeds.   Eyes: Negative.  Negative for blurred vision, double vision and photophobia.  Respiratory: Negative.  Negative for cough and shortness of breath.   Cardiovascular: Negative.  Negative for chest pain, palpitations and leg swelling.  Gastrointestinal: Negative.  Negative for abdominal pain, heartburn, nausea and vomiting.  Genitourinary: Positive for flank pain and frequency. Negative for dysuria, hematuria and urgency.       SEE HPI  Musculoskeletal: Negative for myalgias.       SEE HPI   Neurological: Negative.  Negative for dizziness, focal weakness, seizures and headaches.  Psychiatric/Behavioral: Negative.  Negative for suicidal ideas.    Past Medical History:  Diagnosis Date  . GERD (gastroesophageal reflux disease)     Past Surgical History:  Procedure Laterality Date  . CHOLECYSTECTOMY N/A 12/05/2016   Procedure: LAPAROSCOPIC CHOLECYSTECTOMY WITH INTRAOPERATIVE CHOLANGIOGRAM;  Surgeon: Judeth Horn, MD;  Location: [facility];  Service: General;  Laterality: N/A;  . TUBAL LIGATION      History reviewed. No pertinent family history.  Social History Reviewed with no changes to be made today.   Outpatient Medications Prior to Visit  Medication Sig Dispense Refill  . DULoxetine (CYMBALTA) 30 MG capsule Take 1 capsule (30 mg total) by mouth daily. 30 capsule 5  . esomeprazole (NEXIUM) 40 MG capsule Take 1 capsule (40 mg total) by mouth daily. 30 capsule 3  . fluticasone (FLONASE) 50 MCG/ACT nasal spray Place 2 sprays into both nostrils daily. 16 g 6  . levocetirizine (XYZAL) 5 MG tablet Take 1 tablet (5 mg total) by mouth every evening. (Patient not taking: Reported on 11/24/2017) 30 tablet 0  . valACYclovir (VALTREX) 1000 MG tablet Take 1,000 mg by mouth daily.  99   Facility-Administered Medications Prior to Visit  Medication Dose Route Frequency Provider Last Rate Last Dose  . 0.9 %  sodium chloride infusion  500 mL Intravenous Continuous Irene Shipper, MD        No Known Allergies     Objective:    BP 111/61 (BP Location:  Left Arm, Patient Position: Sitting, Cuff Size: Large)   Pulse 62   Temp 97.9 F (36.6 C) (Oral)   Ht 6\' 2"  (1.88 m)   Wt 217 lb (98.4 kg)   LMP 11/22/2017 (Approximate)   SpO2 93%   BMI 27.86 kg/m  Wt Readings from Last 3 Encounters:  11/24/17 217 lb (98.4 kg)  11/03/17 215 lb 3.2 oz (97.6 kg)  07/08/17 227 lb 6.4 oz (103.1 kg)    Physical Exam  Constitutional: She is oriented to person, place, and time. She appears  well-developed and well-nourished. She is cooperative.  HENT:  Head: Normocephalic and atraumatic.  Eyes: EOM are normal.  Neck: Normal range of motion.  Cardiovascular: Normal rate, regular rhythm, normal heart sounds and intact distal pulses. Exam reveals no gallop and no friction rub.  No murmur heard. Pulmonary/Chest: Effort normal and breath sounds normal. No tachypnea. No respiratory distress. She has no decreased breath sounds. She has no wheezes. She has no rhonchi. She has no rales. She exhibits no tenderness.  Abdominal: Soft. Bowel sounds are normal. She exhibits no distension and no mass. There is CVA tenderness (right). There is no rigidity, no rebound, no guarding, no tenderness at McBurney's point and negative Murphy's sign. No hernia.  Musculoskeletal: Normal range of motion. She exhibits no edema.  Neurological: She is alert and oriented to person, place, and time. Coordination normal.  Skin: Skin is warm and dry.  Psychiatric: She has a normal mood and affect. Her behavior is normal. Judgment and thought content normal.  Nursing note and vitals reviewed.     Patient has been counseled extensively about nutrition and exercise as well as the importance of adherence with medications and regular follow-up. The patient was given clear instructions to go to ER or return to medical center if symptoms don't improve, worsen or new problems develop. The patient verbalized understanding.   Follow-up: Return in about 3 weeks (around 12/15/2017), or if symptoms worsen or fail to improve, for left flank pain .   [name], FNP-BC [facility] and [place], [place]   11/24/2017, 11:32 AM

## 2017-12-09 ENCOUNTER — Ambulatory Visit (INDEPENDENT_AMBULATORY_CARE_PROVIDER_SITE_OTHER): Payer: Self-pay | Admitting: Nurse Practitioner

## 2017-12-09 ENCOUNTER — Other Ambulatory Visit: Payer: Self-pay

## 2017-12-09 ENCOUNTER — Encounter (INDEPENDENT_AMBULATORY_CARE_PROVIDER_SITE_OTHER): Payer: Self-pay | Admitting: Nurse Practitioner

## 2017-12-09 VITALS — BP 130/81 | HR 71 | Temp 98.3°F | Ht 74.0 in | Wt 220.0 lb

## 2017-12-09 DIAGNOSIS — R109 Unspecified abdominal pain: Secondary | ICD-10-CM

## 2017-12-09 NOTE — Patient Instructions (Signed)
Flank Pain Flank pain is pain in your side. The flank is the area of your side between your upper belly (abdomen) and your back. The pain may occur over a short period of time (acute) or may be long-term or come back often (chronic). It may be mild or very bad. Pain in this area can be caused by many different things. Follow these instructions at home:  Rest as told by your doctor.  Drink enough fluid to keep your pee (urine) clear or pale yellow.  Take over-the-counter and prescription medicines only as told by your doctor.  Keep all follow-up visits as told by your doctor. This is important. Contact a doctor if:  Medicine does not help your pain.  You have new symptoms.  Your pain gets worse.  You have a fever.  Your symptoms last longer than 2-3 days. Get help right away if:  Your tummy hurts or is swollen.  You are short of breath.  You feel sick to your stomach (nauseous) and it does not go away.  You cannot stop throwing up (vomiting).  You feel like you will pass out or you do pass out (faint).  You have blood in your pee.  You have a fever and your symptoms suddenly get worse. This information is not intended to replace advice given to you by your health care provider. Make sure you discuss any questions you have with your health care provider. Document Released: 04/07/2008 Document Revised: 03/20/2016 Document Reviewed: 04/02/2015 Elsevier Interactive Patient Education  2018 [employer] Inc.  

## 2017-12-09 NOTE — Progress Notes (Signed)
Assessment & Plan:  Haley Chandler was seen today for flank pain.  Diagnoses and all orders for this visit:  Left flank pain Continue all medications as soon as instructed Use heat application as instructed    Patient has been counseled on age-appropriate routine health concerns for screening and prevention. These are reviewed and up-to-date. Referrals have been placed accordingly. Immunizations are up-to-date or declined.    Subjective:   Chief Complaint  Patient presents with  . Flank Pain   HPI Haley Chandler 47 y.o. female presents to office today for follow up to chronic left flank pain.    Left Flank Pain She endorsed chronic left flank pain in her last office visit with me on 11/24/2017.  Her UA at at that time was normal as well as recent urine cytology. She was prescribed tizanidine at her last office appointment which provided little relief of her pain. Pain usually lasts a few minutes and goes away on its own.  She describes the pain as sharp and intermittent. No aggravating factors and the pain does not occur at night. I instructed her at the last office visit to obtain financial assistance which she still has not done as of today. She may require imaging in the future.    Review of Systems  Constitutional: Negative for fever, malaise/fatigue and weight loss.  HENT: Negative.  Negative for nosebleeds.   Eyes: Negative.  Negative for blurred vision, double vision and photophobia.  Respiratory: Negative.  Negative for cough and shortness of breath.   Cardiovascular: Negative.  Negative for chest pain, palpitations and leg swelling.  Gastrointestinal: Negative.  Negative for heartburn, nausea and vomiting.  Genitourinary: Positive for flank pain.       SEE HPI  Musculoskeletal: Negative for myalgias.  Neurological: Negative.  Negative for dizziness, focal weakness, seizures and headaches.  Psychiatric/Behavioral: Negative.  Negative for suicidal ideas.    Past Medical History:   Diagnosis Date  . GERD (gastroesophageal reflux disease)     Past Surgical History:  Procedure Laterality Date  . CHOLECYSTECTOMY N/A 12/05/2016   Procedure: LAPAROSCOPIC CHOLECYSTECTOMY WITH INTRAOPERATIVE CHOLANGIOGRAM;  Surgeon: Judeth Horn, MD;  Location: [place];  Service: General;  Laterality: N/A;  . TUBAL LIGATION      No family history on file.  Social History Reviewed with no changes to be made today.   Outpatient Medications Prior to Visit  Medication Sig Dispense Refill  . DULoxetine (CYMBALTA) 30 MG capsule Take 1 capsule (30 mg total) by mouth daily. 30 capsule 5  . esomeprazole (NEXIUM) 40 MG capsule Take 1 capsule (40 mg total) by mouth daily. 30 capsule 3  . fluticasone (FLONASE) 50 MCG/ACT nasal spray Place 2 sprays into both nostrils daily. 16 g 6  . tiZANidine (ZANAFLEX) 4 MG capsule Take 1 capsule (4 mg total) by mouth 3 (three) times daily. 30 capsule 0  . valACYclovir (VALTREX) 1000 MG tablet Take 1,000 mg by mouth daily.  99  . levocetirizine (XYZAL) 5 MG tablet Take 1 tablet (5 mg total) by mouth every evening. (Patient not taking: Reported on 11/24/2017) 30 tablet 0   Facility-Administered Medications Prior to Visit  Medication Dose Route Frequency Provider Last Rate Last Dose  . 0.9 %  sodium chloride infusion  500 mL Intravenous Continuous Irene Shipper, MD        No Known Allergies     Objective:    BP 130/81 (BP Location: Left Arm, Patient Position: Sitting, Cuff Size: Normal)  Pulse 71   Temp 98.3 F (36.8 C) (Oral)   LMP 11/22/2017 (Approximate)   SpO2 97%  Wt Readings from Last 3 Encounters:  11/24/17 217 lb (98.4 kg)  11/03/17 215 lb 3.2 oz (97.6 kg)  07/08/17 227 lb 6.4 oz (103.1 kg)    Physical Exam  Constitutional: She is oriented to person, place, and time. She appears well-developed and well-nourished. She is cooperative.  HENT:  Head: Normocephalic and atraumatic.  Eyes: EOM are normal.  Neck: Normal range of motion.   Cardiovascular: Normal rate, regular rhythm, normal heart sounds and intact distal pulses. Exam reveals no gallop and no friction rub.  No murmur heard. Pulmonary/Chest: Effort normal and breath sounds normal. No tachypnea. No respiratory distress. She has no decreased breath sounds. She has no wheezes. She has no rhonchi. She has no rales. She exhibits no tenderness.  Abdominal: Bowel sounds are normal.  Musculoskeletal: Normal range of motion. She exhibits no edema, tenderness or deformity.       Lumbar back: She exhibits no swelling, no pain and no spasm.  Neurological: She is alert and oriented to person, place, and time. Coordination normal.  Skin: Skin is warm and dry.  Psychiatric: She has a normal mood and affect. Her behavior is normal. Judgment and thought content normal.  Nursing note and vitals reviewed.      Patient has been counseled extensively about nutrition and exercise as well as the importance of adherence with medications and regular follow-up. The patient was given clear instructions to go to ER or return to medical center if symptoms don't improve, worsen or new problems develop. The patient verbalized understanding.   Follow-up: Return if symptoms worsen or fail to improve.   [name], FNP-BC [facility] [place], [place]   12/09/2017, 2:52 PM

## 2017-12-17 ENCOUNTER — Ambulatory Visit: Payer: Self-pay | Attending: Physician Assistant

## 2018-01-12 ENCOUNTER — Other Ambulatory Visit: Payer: Self-pay

## 2018-01-12 ENCOUNTER — Other Ambulatory Visit (HOSPITAL_COMMUNITY)
Admission: RE | Admit: 2018-01-12 | Discharge: 2018-01-12 | Disposition: A | Discharge status: Home / Self Care | Payer: Self-pay | Source: Ambulatory Visit | Attending: Physician Assistant | Admitting: Physician Assistant

## 2018-01-12 ENCOUNTER — Ambulatory Visit (INDEPENDENT_AMBULATORY_CARE_PROVIDER_SITE_OTHER): Payer: Self-pay | Admitting: Physician Assistant

## 2018-01-12 ENCOUNTER — Encounter (INDEPENDENT_AMBULATORY_CARE_PROVIDER_SITE_OTHER): Payer: Self-pay | Admitting: Physician Assistant

## 2018-01-12 VITALS — BP 121/79 | HR 64 | Temp 98.3°F | Ht 74.0 in | Wt 221.0 lb

## 2018-01-12 DIAGNOSIS — R350 Frequency of micturition: Secondary | ICD-10-CM

## 2018-01-12 DIAGNOSIS — R809 Proteinuria, unspecified: Secondary | ICD-10-CM

## 2018-01-12 DIAGNOSIS — G8929 Other chronic pain: Secondary | ICD-10-CM

## 2018-01-12 DIAGNOSIS — M546 Pain in thoracic spine: Secondary | ICD-10-CM

## 2018-01-12 DIAGNOSIS — R319 Hematuria, unspecified: Secondary | ICD-10-CM

## 2018-01-12 LAB — POCT URINALYSIS DIPSTICK
Bilirubin, UA: NEGATIVE
Blood, UA: POSITIVE
Glucose, UA: NEGATIVE
KETONES UA: NEGATIVE
Leukocytes, UA: NEGATIVE
NITRITE UA: NEGATIVE
PROTEIN UA: POSITIVE — AB
Spec Grav, UA: 1.02 (ref 1.010–1.025)
Urobilinogen, UA: 1 E.U./dL
pH, UA: 6 (ref 5.0–8.0)

## 2018-01-12 LAB — POCT URINE PREGNANCY: Preg Test, Ur: NEGATIVE

## 2018-01-12 MED ORDER — ACETAMINOPHEN 500 MG PO TABS: 1000.0000 mg | ORAL_TABLET | Freq: Three times a day (TID) | ORAL | 0 refills | Status: AC | PRN

## 2018-01-12 NOTE — Patient Instructions (Signed)
Proteinuria Proteinuria is when there is too much protein in the urine. Proteins are important for buildingmuscles and bones. Proteins are also needed to fight infections, help the blood clot, and keep body fluids in balance. Proteinuria may be mild and temporary, or it may be an early sign of kidney disease. The kidneys make urine. Healthy kidneys also keep substances like proteins from leaving the blood and ending up in the urine. Proteinuria may be a sign that the kidneys are not working well. What are the causes? Healthy kidneys have filters (glomeruli) that keep proteins out of the urine. Proteinuria may mean that the glomeruli are damaged. The main causes of this type of damage are:  Diabetes.  High blood pressure.  Other causes of kidney damage can also cause proteinuria, such as:  Diseases of the immune system, such as lupus, rheumatoid arthritis, sarcoidosis, and Goodpasture syndrome.  Heart disease or heart failure.  Kidney infection.  Certain cancers, including kidney cancer, lymphoma, leukemia, and multiple myeloma.  Amyloidosis. This is a disease that causes abnormal proteins to build up in body tissues.  Reactions to certain medicines, such as NSAIDs.  Injury (trauma) or poisons (toxins).  High blood pressure that occurs during pregnancy (preeclampsia).  Temporary proteinuria may result from conditions that put stress on the kidneys. These conditions usually do not cause kidney damage. They include:  Fever.  Exposure to cold or heat.  Emotional or physical stress.  Extreme exercise.  Standing for long periods of time.  What increases the risk? This condition is more likely to develop in people who:  Have diabetes.  Have high blood pressure.  Have heart disease or heart failure.  Have an immune disease, cancer, or other disease that affects the kidneys.  Have a family history of kidney disease.  Are 65 or older.  Are overweight.  Are of  African-American, American [place], Hispanic/Latino, or [place] descent.  Are pregnant.  Have an infection.  What are the signs or symptoms? Mild proteinuria may not cause symptoms. As more proteins enter the urine, symptoms of kidney disease may develop, such as:  Foamy urine.  Swelling of the face, abdomen, hands, legs, or feet (edema).  Needing to urinate frequently.  Fatigue.  Difficulty sleeping.  Dry and itchy skin.  Nausea and vomiting.  Muscle cramps.  Shortness of breath.  How is this diagnosed? Your health care provider can diagnose proteinuria with a urine test. You may have this test as part of a routine physical or because you have symptoms of kidney disease or risk factors for kidney disease. You may also have:  Blood tests to measure the level of a certain substance (creatinine) that increases with kidney disease.  Imaging tests of your kidney, such as a CT scan or an ultrasound, to look for signs of kidney damage.  How is this treated? If your proteinuria is mild or temporary, no treatment may be needed. Your health care provider may show you how to monitor the level of protein in your urine at home. Identifying proteinuria early is important so that the cause of the condition can be treated. Treatment depends on the cause of your proteinuria. Treatment may include:  Making diet and lifestyle changes.  Getting blood pressure under control.  Getting blood sugar under control, if you have diabetes.  Managing any other medical conditions you have that affect your kidneys.  Giving birth, if you are pregnant.  Avoiding medicines that damage your kidneys.  Treating kidney disease with medicine  and dialysis, as needed.  Follow these instructions at home:  Check your protein levels at home, if directed by your health care provider.  Follow instructions from your health care provider about eating or drinking restrictions.  Take over-the-counter  and prescription medicines only as told by your health care provider.  Return to your normal activities as told by your health care provider. Ask your health care provider what activities are safe for you.  If you are overweight, ask your health care provider to help you with a diet to get to a healthy weight.  Ask your health care provider to help you with an exercise program.  Keep all follow-up visits as told by your health care provider. This is important. Contact a health care provider if:  You have new symptoms.  Your symptoms get worse or do not improve. Get help right away if:  You have back pain.  You have diarrhea.  You vomit.  You have a fever.  You have a rash. This information is not intended to replace advice given to you by your health care provider. Make sure you discuss any questions you have with your health care provider. Document Released: 08/19/2005 Document Revised: 08/09/2015 Document Reviewed: 05/20/2015 Elsevier Interactive Patient Education  2018 Elsevier Inc.  

## 2018-01-12 NOTE — Progress Notes (Signed)
Subjective:  Patient ID: Haley Chandler, female    DOB: Jul 19, 1970  Age: 47 y.o. MRN: [id number]  CC: left flank pain  HPI Haley Chandler a 47 y.o.femalewith a medical history of cholecystectomy, GERD, and GAD presents with left flank pain, "hot" urine, foamy urine, urinary frequency, and left lower back pain. Had been seen here by Ms. Raul Del NP twice before for the same complaint. UA was negative in one of those visits and not testing in the second visit. Imaging was considered but patient does not have funds or CAFA. PAP on 05/26/17 revealed pos BV and neg CT/GC, trichomonas, malignancy, or yeast. Has taken Aleve with minimal alleviation of pain. Does not endorse CP, palpitations, edema, rash, generalized abdominal pain, f/c/n/v, or GI sxs.      Outpatient Medications Prior to Visit  Medication Sig Dispense Refill  . DULoxetine (CYMBALTA) 30 MG capsule Take 1 capsule (30 mg total) by mouth daily. (Patient not taking: Reported on 01/12/2018) 30 capsule 5  . esomeprazole (NEXIUM) 40 MG capsule Take 1 capsule (40 mg total) by mouth daily. (Patient not taking: Reported on 01/12/2018) 30 capsule 3  . fluticasone (FLONASE) 50 MCG/ACT nasal spray Place 2 sprays into both nostrils daily. (Patient not taking: Reported on 01/12/2018) 16 g 6  . tiZANidine (ZANAFLEX) 4 MG capsule Take 1 capsule (4 mg total) by mouth 3 (three) times daily. (Patient not taking: Reported on 01/12/2018) 30 capsule 0  . valACYclovir (VALTREX) 1000 MG tablet Take 1,000 mg by mouth daily.  99   Facility-Administered Medications Prior to Visit  Medication Dose Route Frequency Provider Last Rate Last Dose  . 0.9 %  sodium chloride infusion  500 mL Intravenous Continuous Irene Shipper, MD         ROS Review of Systems  Constitutional: Negative for chills, fever and malaise/fatigue.  Eyes: Negative for blurred vision.  Respiratory: Negative for shortness of breath.   Cardiovascular: Negative for chest pain and palpitations.   Gastrointestinal: Negative for abdominal pain and nausea.  Genitourinary: Positive for flank pain and frequency. Negative for dysuria and hematuria.  Musculoskeletal: Positive for back pain. Negative for joint pain and myalgias.  Skin: Negative for rash.  Neurological: Negative for tingling and headaches.  Psychiatric/Behavioral: Negative for depression. The patient is not nervous/anxious.     Objective:  BP 121/79 (BP Location: Left Arm, Patient Position: Sitting, Cuff Size: Large)   Pulse 64   Temp 98.3 F (36.8 C) (Oral)   Ht 6\' 2"  (1.88 m)   Wt 221 lb (100.2 kg)   LMP 12/20/2017 (Exact Date)   SpO2 100%   BMI 28.37 kg/m   BP/Weight 01/12/2018 12/09/2017 7/48/9562  Systolic BP 130 865 784  Diastolic BP 79 81 61  Wt. (Lbs) 221 220 217  BMI 28.37 28.25 27.86      Physical Exam  Constitutional: She is oriented to person, place, and time.  Well developed, well nourished, NAD, polite  HENT:  Head: Normocephalic and atraumatic.  Eyes: No scleral icterus.  Neck: Normal range of motion. Neck supple. No thyromegaly present.  Cardiovascular: Normal rate, regular rhythm and normal heart sounds.  Pulmonary/Chest: Effort normal and breath sounds normal. No respiratory distress. She has no wheezes. She has no rales.  Abdominal: Soft. Bowel sounds are normal. There is tenderness (mild left flank TTP).  Musculoskeletal: She exhibits no edema.  Mild TTP of the left lower back, no muscular spasm. Full aROM of back.  Neurological: She is alert and oriented  to person, place, and time.  Skin: Skin is warm and dry. No rash noted. No erythema. No pallor.  Psychiatric: She has a normal mood and affect. Her behavior is normal. Thought content normal.  Vitals reviewed.    Assessment & Plan:      1. Urinary frequency - Urinalysis Dipstick pos for protein, blood, urobilinogen,  - Urine cytology ancillary only - POCT urine pregnancy - CBC with Differential - Comprehensive metabolic  panel - ANA w/Reflex  2. Chronic left-sided thoracic back pain - CT RENAL ABDOMEN W WO CONTRAST; Future - CBC with Differential - Comprehensive metabolic panel - ANA w/Reflex - Begin acetaminophen (TYLENOL) 500 MG tablet; Take 2 tablets (1,000 mg total) by mouth every 8 (eight) hours as needed for up to 7 days.  Dispense: 21 tablet; Refill: 0 - Stop Aleve  3. Proteinuria, unspecified type - CT RENAL ABDOMEN W WO CONTRAST; Future - CBC with Differential - Comprehensive metabolic panel - ANA w/Reflex - Lipid panel - Microalbumin/Creatinine Ratio, Urine  4. Hematuria, unspecified type - Lipid panel   Meds ordered this encounter  Medications  . acetaminophen (TYLENOL) 500 MG tablet    Sig: Take 2 tablets (1,000 mg total) by mouth every 8 (eight) hours as needed for up to 7 days.    Dispense:  21 tablet    Refill:  0    Order Specific Question:   Supervising Provider    Answer:   Charlott Rakes [[id number]]    Follow-up: Return in about 3 weeks (around 02/02/2018) for proteinuria, flank pain.   Clent Demark PA

## 2018-01-13 LAB — COMPREHENSIVE METABOLIC PANEL
ALT: 25 IU/L (ref 0–32)
AST: 22 IU/L (ref 0–40)
Albumin/Globulin Ratio: 1.4 (ref 1.2–2.2)
Albumin: 4.2 g/dL (ref 3.5–5.5)
Alkaline Phosphatase: 32 IU/L — ABNORMAL LOW (ref 39–117)
BUN/Creatinine Ratio: 9 (ref 9–23)
BUN: 8 mg/dL (ref 6–24)
Bilirubin Total: 0.6 mg/dL (ref 0.0–1.2)
CALCIUM: 9.2 mg/dL (ref 8.7–10.2)
CHLORIDE: 105 mmol/L (ref 96–106)
CO2: 23 mmol/L (ref 20–29)
Creatinine, Ser: 0.88 mg/dL (ref 0.57–1.00)
GFR calc Af Amer: 90 mL/min/{1.73_m2} (ref >59)
GFR, EST NON AFRICAN AMERICAN: 78 mL/min/{1.73_m2} (ref >59)
GLUCOSE: 90 mg/dL (ref 65–99)
Globulin, Total: 3.1 g/dL (ref 1.5–4.5)
Potassium: 4.4 mmol/L (ref 3.5–5.2)
Sodium: 140 mmol/L (ref 134–144)
TOTAL PROTEIN: 7.3 g/dL (ref 6.0–8.5)

## 2018-01-13 LAB — MICROALBUMIN / CREATININE URINE RATIO
Creatinine, Urine: 260.3 mg/dL
Microalb/Creat Ratio: 3.5 mg/g creat (ref 0.0–30.0)
Microalbumin, Urine: 9.1 ug/mL

## 2018-01-13 LAB — CBC WITH DIFFERENTIAL/PLATELET
BASOS ABS: 0 10*3/uL (ref 0.0–0.2)
Basos: 1 %
EOS (ABSOLUTE): 0 10*3/uL (ref 0.0–0.4)
Eos: 1 %
Hematocrit: 36.9 % (ref 34.0–46.6)
Hemoglobin: 12.2 g/dL (ref 11.1–15.9)
IMMATURE GRANS (ABS): 0 10*3/uL (ref 0.0–0.1)
IMMATURE GRANULOCYTES: 0 %
LYMPHS: 45 %
Lymphocytes Absolute: 1.8 10*3/uL (ref 0.7–3.1)
MCH: 30.3 pg (ref 26.6–33.0)
MCHC: 33.1 g/dL (ref 31.5–35.7)
MCV: 92 fL (ref 79–97)
Monocytes Absolute: 0.3 10*3/uL (ref 0.1–0.9)
Monocytes: 7 %
NEUTROS PCT: 46 %
Neutrophils Absolute: 1.8 10*3/uL (ref 1.4–7.0)
Platelets: 333 10*3/uL (ref 150–450)
RBC: 4.03 x10E6/uL (ref 3.77–5.28)
RDW: 12 % — ABNORMAL LOW (ref 12.3–15.4)
WBC: 4 10*3/uL (ref 3.4–10.8)

## 2018-01-13 LAB — LIPID PANEL
CHOL/HDL RATIO: 2.2 ratio (ref 0.0–4.4)
Cholesterol, Total: 157 mg/dL (ref 100–199)
HDL: 71 mg/dL (ref >39)
LDL Calculated: 78 mg/dL (ref 0–99)
TRIGLYCERIDES: 39 mg/dL (ref 0–149)
VLDL Cholesterol Cal: 8 mg/dL (ref 5–40)

## 2018-01-13 LAB — ENA+DNA/DS+SJORGEN'S
ENA RNP Ab: 1.8 AI — ABNORMAL HIGH (ref 0.0–0.9)
ENA SM Ab Ser-aCnc: <0.2 AI (ref 0.0–0.9)
ENA SSA (RO) AB: 6.2 AI — AB (ref 0.0–0.9)
ENA SSB (LA) AB: 0.3 AI (ref 0.0–0.9)

## 2018-01-13 LAB — ANA W/REFLEX: Anti Nuclear Antibody(ANA): POSITIVE — AB

## 2018-01-14 ENCOUNTER — Encounter (HOSPITAL_COMMUNITY): Payer: Self-pay | Admitting: Emergency Medicine

## 2018-01-14 ENCOUNTER — Telehealth (INDEPENDENT_AMBULATORY_CARE_PROVIDER_SITE_OTHER): Payer: Self-pay

## 2018-01-14 ENCOUNTER — Ambulatory Visit (HOSPITAL_COMMUNITY)
Admission: EM | Admit: 2018-01-14 | Discharge: 2018-01-14 | Disposition: A | Discharge status: Home / Self Care | Payer: Self-pay | Attending: Family Medicine | Admitting: Family Medicine

## 2018-01-14 ENCOUNTER — Other Ambulatory Visit (INDEPENDENT_AMBULATORY_CARE_PROVIDER_SITE_OTHER): Payer: Self-pay | Admitting: Physician Assistant

## 2018-01-14 DIAGNOSIS — H8110 Benign paroxysmal vertigo, unspecified ear: Secondary | ICD-10-CM

## 2018-01-14 DIAGNOSIS — R768 Other specified abnormal immunological findings in serum: Secondary | ICD-10-CM

## 2018-01-14 LAB — URINE CYTOLOGY ANCILLARY ONLY
Chlamydia: NEGATIVE
NEISSERIA GONORRHEA: NEGATIVE
TRICH (WINDOWPATH): NEGATIVE

## 2018-01-14 LAB — POCT I-STAT, CHEM 8
BUN: 9 mg/dL (ref 6–20)
CHLORIDE: 103 mmol/L (ref 98–111)
CREATININE: 0.9 mg/dL (ref 0.44–1.00)
Calcium, Ion: 1.22 mmol/L (ref 1.15–1.40)
Glucose, Bld: 80 mg/dL (ref 70–99)
HEMATOCRIT: 41 % (ref 36.0–46.0)
HEMOGLOBIN: 13.9 g/dL (ref 12.0–15.0)
POTASSIUM: 4.3 mmol/L (ref 3.5–5.1)
Sodium: 141 mmol/L (ref 135–145)
TCO2: 28 mmol/L (ref 22–32)

## 2018-01-14 MED ORDER — MECLIZINE HCL 25 MG PO TABS: 25.0000 mg | ORAL_TABLET | Freq: Three times a day (TID) | ORAL | 0 refills | Status: DC | PRN

## 2018-01-14 NOTE — ED Triage Notes (Signed)
Pt has multiple symptoms, c/o L flank pain x2 years. Pt also c/o L side buttocks pain. Pt states after her gallbladder surgery last year and ever since then shes had pain in the same spot. Pt had a UA done last month for the flank pain and her urine was clear. Pt has had multiple blood tests with her PCP two days ago. Pt also c/o Pain in her R arm. Pt also c/o dizziness.

## 2018-01-14 NOTE — Discharge Instructions (Addendum)
Blood work unremarkable Orthostatic VS within normal limits EKG did not show concerning features However, we cannot definitively rule out all neurological or cardiac causes today in the urgent care setting. Symptoms most likely due to vertigo Meclizine prescribed If symptoms persists follow up with PCP If you experience any new or worsening symptoms return here or go the ED

## 2018-01-14 NOTE — Telephone Encounter (Signed)
-----   Message from Clent Demark, PA-C sent at 01/14/2018 12:04 PM EDT ----- Autoimmune disease detected. Will have to send to rheumatology to further differentiate between diseases. Rest of labs normal. I have put in rheumatology referral.

## 2018-01-14 NOTE — ED Provider Notes (Signed)
[facility] [place]   [id number] 01/14/18 Arrival Time: 1501  SUBJECTIVE:  Patient complains of dizziness that began this morning  Denies a precipitating event, trauma, or recent URI within the past month.  Describes the dizziness as "the room spinning."   States that it is intermittent and is unsure how long episodes last.  Has NOT tried OTC medications.  Symptoms made worse with driving and position changes.  Admits to previous symptoms in the past that resolved with rest.  Complains of chills, nausea, tinnitus, SOB, and right arm numbness and pain.  Denies fever, vomiting, hearing changes, ear pain, chest pain, syncope, weakness, slurred speech, memory or emotional changes, facial drooping/ asymmetry, incoordination, abdominal pain, changes in bowel or bladder habits.     ROS: As per HPI. Past Medical History:  Diagnosis Date  . GERD (gastroesophageal reflux disease)    Past Surgical History:  Procedure Laterality Date  . CHOLECYSTECTOMY N/A 12/05/2016   Procedure: LAPAROSCOPIC CHOLECYSTECTOMY WITH INTRAOPERATIVE CHOLANGIOGRAM;  Surgeon: Judeth Horn, MD;  Location: [place];  Service: General;  Laterality: N/A;  . TUBAL LIGATION     No Known Allergies Current Facility-Administered Medications on File Prior to Encounter  Medication Dose Route Frequency Provider Last Rate Last Dose  . 0.9 %  sodium chloride infusion  500 mL Intravenous Continuous Irene Shipper, MD       Current Outpatient Medications on File Prior to Encounter  Medication Sig Dispense Refill  . acetaminophen (TYLENOL) 500 MG tablet Take 2 tablets (1,000 mg total) by mouth every 8 (eight) hours as needed for up to 7 days. 21 tablet 0  . DULoxetine (CYMBALTA) 30 MG capsule Take 1 capsule (30 mg total) by mouth daily. (Patient not taking: Reported on 01/12/2018) 30 capsule 5  . esomeprazole (NEXIUM) 40 MG capsule Take 1 capsule (40 mg total) by mouth daily. (Patient not taking: Reported on 01/12/2018) 30 capsule 3  .  fluticasone (FLONASE) 50 MCG/ACT nasal spray Place 2 sprays into both nostrils daily. (Patient not taking: Reported on 01/12/2018) 16 g 6  . tiZANidine (ZANAFLEX) 4 MG capsule Take 1 capsule (4 mg total) by mouth 3 (three) times daily. (Patient not taking: Reported on 01/12/2018) 30 capsule 0  . valACYclovir (VALTREX) 1000 MG tablet Take 1,000 mg by mouth daily.  41   Social History   Socioeconomic History  . Marital status: Single    Spouse name: Not on file  . Number of children: Not on file  . Years of education: Not on file  . Highest education level: Not on file  Occupational History  . Not on file  Social Needs  . Financial resource strain: Not on file  . Food insecurity:    Worry: Not on file    Inability: Not on file  . Transportation needs:    Medical: Not on file    Non-medical: Not on file  Tobacco Use  . Smoking status: Never Smoker  . Smokeless tobacco: Never Used  Substance and Sexual Activity  . Alcohol use: Yes    Comment: occ  . Drug use: No  . Sexual activity: Not on file  Lifestyle  . Physical activity:    Days per week: Not on file    Minutes per session: Not on file  . Stress: Not on file  Relationships  . Social connections:    Talks on phone: Not on file    Gets together: Not on file    Attends religious service: Not on file  Active member of club or organization: Not on file    Attends meetings of clubs or organizations: Not on file    Relationship status: Not on file  . Intimate partner violence:    Fear of current or ex partner: Not on file    Emotionally abused: Not on file    Physically abused: Not on file    Forced sexual activity: Not on file  Other Topics Concern  . Not on file  Social History Narrative  . Not on file   No family history on file.  OBJECTIVE:  Vitals:   01/14/18 1601  BP: 127/62  Pulse: 75  Resp: 16  Temp: 97.8 F (36.6 C)  SpO2: 100%    Orthostatic VS for the past 24 hrs:  BP- Lying Pulse- Lying BP-  Sitting Pulse- Sitting BP- Standing at 0 minutes Pulse- Standing at 0 minutes  01/14/18 1726 139/79 62 137/78 65 134/85 73     General appearance: alert; no distress Eyes: PERRLA; EOMI; conjunctiva normal HENT: normocephalic; atraumatic; TMs normal; nasal mucosa normal; oral mucosa normal Neck: supple with FROM Lungs: clear to auscultation bilaterally Heart: regular rate and rhythm Abdomen: soft, non-tender; bowel sounds normal Extremities: no cyanosis or edema; symmetrical with no gross deformities Skin: warm and dry Neurologic: normal gait; strength and sensation intacta about the upper and lower extremities; CN 2-12 grossly intact; Finger to nose without difficultly; negative pronator drift Psychological: alert and cooperative; normal mood and affect  EKG:  EKG shows normal sinus rhythm without ST elevations or depressions.  No t-wave inversions.  Comparable with past EKG.  Reviewed with Dr. Meda Coffee.    LABS:  Results for orders placed or performed during the hospital encounter of 01/14/18 (from the past 24 hour(s))  I-STAT, chem 8     Status: None   Collection Time: 01/14/18  5:08 PM  Result Value Ref Range   Sodium 141 135 - 145 mmol/L   Potassium 4.3 3.5 - 5.1 mmol/L   Chloride 103 98 - 111 mmol/L   BUN 9 6 - 20 mg/dL   Creatinine, Ser 0.90 0.44 - 1.00 mg/dL   Glucose, Bld 80 70 - 99 mg/dL   Calcium, Ion 1.22 1.15 - 1.40 mmol/L   TCO2 28 22 - 32 mmol/L   Hemoglobin 13.9 12.0 - 15.0 g/dL   HCT 41.0 36.0 - 46.0 %    ASSESSMENT & PLAN:  1. Benign paroxysmal positional vertigo, unspecified laterality     Meds ordered this encounter  Medications  . meclizine (ANTIVERT) 25 MG tablet    Sig: Take 1 tablet (25 mg total) by mouth 3 (three) times daily as needed for dizziness.    Dispense:  30 tablet    Refill:  0    Order Specific Question:   Supervising Provider    Answer:   Wynona Luna [[id number]]   Blood work unremarkable Orthostatic VS within normal  limits EKG did not show concerning features However, we cannot definitively rule out all neurological or cardiac causes today in the urgent care setting. Symptoms most likely due to vertigo Meclizine prescribed If symptoms persists follow up with PCP If you experience any new or worsening symptoms return here or go the ED   Reviewed expectations re: course of current medical issues. Questions answered. Outlined signs and symptoms indicating need for more acute intervention. Patient verbalized understanding. After Visit Summary given.    Lestine Box, PA-C 01/14/18 1750

## 2018-01-14 NOTE — Telephone Encounter (Signed)
Patient is aware that autoimmune disease was detected and she has been referred to rheumatology. Explained to patient what an autoimmune disease is and explained the referral process. Patient aware that rheumatology will contact her directly to schedule. Advised patient to answer all phone calls and clear voicemail box. Patient aware that all other labs are normal. Patient expressed understanding. Haley Chandler

## 2018-01-17 LAB — URINE CYTOLOGY ANCILLARY ONLY
Bacterial vaginitis: NEGATIVE
CANDIDA VAGINITIS: NEGATIVE

## 2018-01-17 MED FILL — MECLIZINE 25 MG TABLET: 25 | 10 days supply | Qty: 30 | Fill #0

## 2018-01-18 ENCOUNTER — Encounter (HOSPITAL_COMMUNITY): Payer: Self-pay

## 2018-01-18 ENCOUNTER — Ambulatory Visit (HOSPITAL_COMMUNITY)
Admission: RE | Admit: 2018-01-18 | Discharge: 2018-01-18 | Disposition: A | Discharge status: Home / Self Care | Payer: Self-pay | Source: Ambulatory Visit | Attending: Physician Assistant | Admitting: Physician Assistant

## 2018-01-18 ENCOUNTER — Other Ambulatory Visit (INDEPENDENT_AMBULATORY_CARE_PROVIDER_SITE_OTHER): Payer: Self-pay | Admitting: Physician Assistant

## 2018-01-18 DIAGNOSIS — G8929 Other chronic pain: Secondary | ICD-10-CM

## 2018-01-18 DIAGNOSIS — M546 Pain in thoracic spine: Secondary | ICD-10-CM

## 2018-01-18 DIAGNOSIS — R809 Proteinuria, unspecified: Secondary | ICD-10-CM

## 2018-01-18 DIAGNOSIS — N2889 Other specified disorders of kidney and ureter: Secondary | ICD-10-CM | POA: Insufficient documentation

## 2018-01-18 DIAGNOSIS — R1032 Left lower quadrant pain: Secondary | ICD-10-CM

## 2018-01-18 DIAGNOSIS — N1330 Unspecified hydronephrosis: Secondary | ICD-10-CM | POA: Insufficient documentation

## 2018-01-19 ENCOUNTER — Telehealth (INDEPENDENT_AMBULATORY_CARE_PROVIDER_SITE_OTHER): Payer: Self-pay

## 2018-01-19 NOTE — Telephone Encounter (Signed)
-----   Message from Clent Demark, PA-C sent at 01/18/2018  8:45 AM EDT ----- Negative for BV and yeast. I also ordered for chlamydia and gonorrhea but this is not reported. Please inquire as to why this was not done.

## 2018-01-19 NOTE — Telephone Encounter (Signed)
Patient aware of negative BV, yeast, gonorrhea, trichomonas and chlamydia. Nat Christen, CMA

## 2018-01-20 ENCOUNTER — Other Ambulatory Visit (INDEPENDENT_AMBULATORY_CARE_PROVIDER_SITE_OTHER): Payer: Self-pay | Admitting: Physician Assistant

## 2018-01-20 DIAGNOSIS — R93429 Abnormal radiologic findings on diagnostic imaging of unspecified kidney: Secondary | ICD-10-CM

## 2018-01-20 MED ORDER — TAMSULOSIN HCL 0.4 MG PO CAPS: 0.4000 mg | ORAL_CAPSULE | Freq: Every day | ORAL | 1 refills | Status: DC

## 2018-01-20 MED FILL — TAMSULOSIN HCL 0.4 MG CAP: 0.4 | 30 days supply | Qty: 30 | Fill #0

## 2018-01-21 ENCOUNTER — Telehealth (INDEPENDENT_AMBULATORY_CARE_PROVIDER_SITE_OTHER): Payer: Self-pay

## 2018-01-21 NOTE — Telephone Encounter (Signed)
-----   Message from Clent Demark, PA-C sent at 01/20/2018  8:55 AM EDT ----- Possible small renal stone on the left side. Right kidney with mild fluid retention. I am sending tamsulosin to try to help with expulsion of possible small stone. I would like for her to keep her follow up appointment. It is important also that she see rheumatology. I have already made referral to rheumatology six days ago.

## 2018-01-21 NOTE — Telephone Encounter (Signed)
Patient is aware that imaging shows possible small renal (kidney) stone one the left side with mild fluid retention in the right kidney. tamsulosin sent to pharmacy to help with the expulsion of the possible small stone. Keep follow up appointment with PCP. Referral has been placed to rheumatology. Be sure to keep rheumatology appointment once scheduled. Patient expressed understanding. Nat Christen, CMA

## 2018-01-24 ENCOUNTER — Telehealth (INDEPENDENT_AMBULATORY_CARE_PROVIDER_SITE_OTHER): Payer: Self-pay

## 2018-01-24 NOTE — Telephone Encounter (Signed)
Patient wants to know if PCP will be prescribing medication for the mild fluid retention in her right kidney. Please advise?

## 2018-01-25 NOTE — Telephone Encounter (Signed)
I have already sent tamsulosin. She should follow up with me after taking tamsulosin.

## 2018-01-25 NOTE — Telephone Encounter (Signed)
Patient aware. Haley Chandler, CMA  

## 2018-02-01 ENCOUNTER — Telehealth (INDEPENDENT_AMBULATORY_CARE_PROVIDER_SITE_OTHER): Payer: Self-pay | Admitting: Physician Assistant

## 2018-02-01 NOTE — Telephone Encounter (Signed)
Patient aware. Maximilien Hayashi S Kirkland Figg, CMA  

## 2018-02-01 NOTE — Telephone Encounter (Signed)
Yes, that is fine. 

## 2018-02-01 NOTE — Telephone Encounter (Signed)
Please advise 

## 2018-02-01 NOTE — Telephone Encounter (Signed)
Patient called wanting to know if it was okay for her to take Tamsulosin and Duloxetine together since she was recently prescribed duloxetine.  Please Advice [employer]

## 2018-02-09 ENCOUNTER — Other Ambulatory Visit: Payer: Self-pay

## 2018-02-09 ENCOUNTER — Ambulatory Visit (INDEPENDENT_AMBULATORY_CARE_PROVIDER_SITE_OTHER): Payer: Self-pay | Admitting: Physician Assistant

## 2018-02-09 ENCOUNTER — Encounter (INDEPENDENT_AMBULATORY_CARE_PROVIDER_SITE_OTHER): Payer: Self-pay | Admitting: Physician Assistant

## 2018-02-09 VITALS — BP 126/84 | HR 56 | Temp 97.9°F | Ht 74.0 in | Wt 216.2 lb

## 2018-02-09 DIAGNOSIS — N1330 Unspecified hydronephrosis: Secondary | ICD-10-CM

## 2018-02-09 DIAGNOSIS — F418 Other specified anxiety disorders: Secondary | ICD-10-CM

## 2018-02-09 DIAGNOSIS — R109 Unspecified abdominal pain: Secondary | ICD-10-CM

## 2018-02-09 DIAGNOSIS — R768 Other specified abnormal immunological findings in serum: Secondary | ICD-10-CM

## 2018-02-09 DIAGNOSIS — R350 Frequency of micturition: Secondary | ICD-10-CM

## 2018-02-09 LAB — POCT URINALYSIS DIPSTICK
Bilirubin, UA: NEGATIVE
Glucose, UA: NEGATIVE
KETONES UA: NEGATIVE
Leukocytes, UA: NEGATIVE
NITRITE UA: NEGATIVE
PROTEIN UA: NEGATIVE
Spec Grav, UA: <=1.005 — AB (ref 1.010–1.025)
Urobilinogen, UA: 0.2 E.U./dL
pH, UA: 6.5 (ref 5.0–8.0)

## 2018-02-09 NOTE — Progress Notes (Signed)
Subjective:  Patient ID: Haley Chandler, female    DOB: June 13, 1971  Age: 47 y.o. MRN: [id number]  CC: f/u proteinuria and left flank pain  HPI Haley Chandler a 47 y.o.femalewith a medical history of cholecystectomy, GERD,and GAD presents with persistent eft flank pain, "hot" urine, foamy urine, urinary frequency, and left lower back pain. However symptoms are mildly reduced in intensity since taking Cymbalta. Has been taking Cymbalta since 4-5 days ago with reduction of left flank pain. Thinks she is also less anxious. Negative Chlamydia, gonorrhea, and trichomonas.     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 Pt was referred to [facility] Rheumatology but was declined to be seen by Dr. Estanislado Pandy with no explanation given. 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 Pt very concerned/anxious about receiving further evaluation for her positive findings.     Outpatient Medications Prior to Visit  Medication Sig Dispense Refill  . meclizine (ANTIVERT) 25 MG tablet Take 1 tablet (25 mg total) by mouth 3 (three) times daily as needed for dizziness. 30 tablet 0  . tamsulosin (FLOMAX) 0.4 MG CAPS capsule Take 1 capsule (0.4 mg total) by mouth daily. 30 capsule 1  . valACYclovir (VALTREX) 1000 MG tablet Take 1,000 mg by mouth daily.  99   Facility-Administered Medications Prior to Visit  Medication Dose Route Frequency Provider Last Rate Last Dose  . 0.9 %  sodium chloride infusion  500 mL Intravenous Continuous Irene Shipper, MD         ROS Review of Systems  Constitutional: Negative for chills, fever and malaise/fatigue.  Eyes: Negative for blurred vision.  Respiratory: Negative for shortness of breath.   Cardiovascular: Negative for chest pain and palpitations.  Gastrointestinal: Positive for abdominal pain. Negative for nausea.  Genitourinary: Negative for dysuria and hematuria.  Musculoskeletal: Negative for joint pain and myalgias.  Skin: Negative for rash.  Neurological: Negative for  tingling and headaches.  Psychiatric/Behavioral: Negative for depression. The patient is nervous/anxious.     Objective:  BP 126/84 (BP Location: Left Arm, Patient Position: Sitting, Cuff Size: Normal)   Pulse (!) 56   Temp 97.9 F (36.6 C) (Oral)   Ht 6\' 2"  (1.88 m)   Wt 216 lb 3.2 oz (98.1 kg)   LMP 02/08/2018   SpO2 100%   BMI 27.76 kg/m   BP/Weight 02/09/2018 03/19/2951 02/14/1323  Systolic BP 401 027 253  Diastolic BP 84 62 79  Wt. (Lbs) 216.2 - 221  BMI 27.76 - 28.37      Physical Exam  Constitutional: She is oriented to person, place, and time.  Well developed, well nourished, NAD, polite  HENT:  Head: Normocephalic and atraumatic.  Eyes: No scleral icterus.  Neck: Normal range of motion. Neck supple. No thyromegaly present.  Cardiovascular: Normal rate, regular rhythm and normal heart sounds.  Pulmonary/Chest: Effort normal and breath sounds normal.  Abdominal: Soft. Bowel sounds are normal. There is tenderness (mild left flank TTP).  Musculoskeletal: She exhibits no edema.  Neurological: She is alert and oriented to person, place, and time.  Skin: Skin is warm and dry. No rash noted. No erythema. No pallor.  Psychiatric: She has a normal mood and affect. Her behavior is normal. Thought content normal.  Vitals reviewed.    Assessment & Plan:   1. Positive ANA (antinuclear antibody) - Pt rejected by Dr. Estanislado Pandy from rheumatology without explanation. Patient 100% CAFA. I have advised to begin charity care process [facility]. Printed a copy of her ANA with  reflex results.   2. Left flank pain - Urinalysis Dipstick negative  3. Urinary frequency - Urinalysis Dipstick negative  4. Hydronephrosis, unspecified hydronephrosis type - F/u on mild right sided hydronephrosis. Does not have any right sided symptoms. Took Tamsulosin as directed. Will consider nephrology referral if US renal shows persistent hydronephrosis. - Basic Metabolic Panel - US Renal;  Future  5. Depression with anxiety - Continue on Cymbalta 30 mg.    Follow-up:  6 weeks for pos ANA  Clent Demark PA

## 2018-02-09 NOTE — Patient Instructions (Signed)
Antinuclear Antibody Test Why am I having this test? This is a test used to help diagnose systemic lupus erythematosus (SLE) and other autoimmune diseases. An autoimmune disease is a disease in which the body's own defense system (immune system) attacks the body. This test checks for antinuclear antibodies (ANA) in the blood. The presence of ANA is associated with several autoimmune diseases. It is most commonly seen with SLE. What kind of sample is taken? A blood sample is required for this test. It is usually collected by inserting a needle into a vein. How do I prepare for this test? There is no preparation required for this test. How are the results reported? Your test results will be reported as either positive or negative. It is your responsibility to obtain your test results. Ask the lab or department performing the test when and how you will get your results. What do the results mean? A positive test may mean you have:  SLE.  An autoimmune disease.  Liver dysfunction.  Leukemia.  Infectious mononucleosis.  Talk with your health care provider to discuss your results, treatment options, and if necessary, the need for more tests. Talk with your health care provider if you have any questions about your results. Talk with your health care provider to discuss your results, treatment options, and if necessary, the need for more tests. Talk with your health care provider if you have any questions about your results. This information is not intended to replace advice given to you by your health care provider. Make sure you discuss any questions you have with your health care provider. Document Released: 07/21/2004 Document Revised: 03/02/2016 Document Reviewed: 11/28/2013 Elsevier Interactive Patient Education  2018 Elsevier Inc.  

## 2018-02-10 ENCOUNTER — Telehealth (INDEPENDENT_AMBULATORY_CARE_PROVIDER_SITE_OTHER): Payer: Self-pay

## 2018-02-10 LAB — BASIC METABOLIC PANEL
BUN/Creatinine Ratio: 8 — ABNORMAL LOW (ref 9–23)
BUN: 8 mg/dL (ref 6–24)
CO2: 23 mmol/L (ref 20–29)
CREATININE: 0.97 mg/dL (ref 0.57–1.00)
Calcium: 9.7 mg/dL (ref 8.7–10.2)
Chloride: 103 mmol/L (ref 96–106)
GFR calc Af Amer: 80 mL/min/{1.73_m2} (ref >59)
GFR, EST NON AFRICAN AMERICAN: 69 mL/min/{1.73_m2} (ref >59)
GLUCOSE: 87 mg/dL (ref 65–99)
Potassium: 4.8 mmol/L (ref 3.5–5.2)
Sodium: 139 mmol/L (ref 134–144)

## 2018-02-10 NOTE — Telephone Encounter (Signed)
Patient aware of normal kidney filtration. Haley Chandler, CMA

## 2018-02-10 NOTE — Telephone Encounter (Signed)
-----   Message from Clent Demark, PA-C sent at 02/10/2018  8:35 AM EDT ----- Kidney filtration normal.

## 2018-02-11 ENCOUNTER — Ambulatory Visit (HOSPITAL_COMMUNITY)
Admission: RE | Admit: 2018-02-11 | Discharge: 2018-02-11 | Disposition: A | Discharge status: Home / Self Care | Payer: Self-pay | Source: Ambulatory Visit | Attending: Physician Assistant | Admitting: Physician Assistant

## 2018-02-11 DIAGNOSIS — N1330 Unspecified hydronephrosis: Secondary | ICD-10-CM | POA: Insufficient documentation

## 2018-02-11 DIAGNOSIS — N281 Cyst of kidney, acquired: Secondary | ICD-10-CM | POA: Insufficient documentation

## 2018-02-14 ENCOUNTER — Other Ambulatory Visit (INDEPENDENT_AMBULATORY_CARE_PROVIDER_SITE_OTHER): Payer: Self-pay | Admitting: Physician Assistant

## 2018-02-14 ENCOUNTER — Telehealth (INDEPENDENT_AMBULATORY_CARE_PROVIDER_SITE_OTHER): Payer: Self-pay

## 2018-02-14 DIAGNOSIS — N1330 Unspecified hydronephrosis: Secondary | ICD-10-CM

## 2018-02-14 NOTE — Telephone Encounter (Signed)
-----   Message from Clent Demark, PA-C sent at 02/14/2018  1:43 PM EDT ----- Mild persistent hydronephrosis. I have made referral to Nephrology.

## 2018-02-14 NOTE — Telephone Encounter (Signed)
Patient is aware of persistent hydronephrosis, referral placed to nephrology. Nat Christen, CMA

## 2018-02-22 ENCOUNTER — Other Ambulatory Visit (INDEPENDENT_AMBULATORY_CARE_PROVIDER_SITE_OTHER): Payer: Self-pay | Admitting: Physician Assistant

## 2018-02-22 DIAGNOSIS — N1330 Unspecified hydronephrosis: Secondary | ICD-10-CM

## 2018-03-04 ENCOUNTER — Telehealth (INDEPENDENT_AMBULATORY_CARE_PROVIDER_SITE_OTHER): Payer: Self-pay | Admitting: Physician Assistant

## 2018-03-04 ENCOUNTER — Other Ambulatory Visit (INDEPENDENT_AMBULATORY_CARE_PROVIDER_SITE_OTHER): Payer: Self-pay | Admitting: Physician Assistant

## 2018-03-04 MED ORDER — DULOXETINE HCL 30 MG PO CPEP: 30.0000 mg | ORAL_CAPSULE | Freq: Every day | ORAL | 5 refills | Status: DC

## 2018-03-04 MED FILL — ?DULOXETINE HCL 30 MG CPEP: 30 | 30 days supply | Qty: 30 | Fill #0

## 2018-03-04 NOTE — Telephone Encounter (Signed)
Duloxetine sent to [facility]. Please notify pt.

## 2018-03-04 NOTE — Telephone Encounter (Signed)
Patient called requesting medication refill for DULoxetine (CYMBALTA) 30 MG capsule   Patient uses [facility], [address]  Please advice [phone]  Thank you Emmit Pomfret

## 2018-03-04 NOTE — Telephone Encounter (Signed)
Patient is aware. Haley Chandler, CMA  

## 2018-03-23 ENCOUNTER — Other Ambulatory Visit: Payer: Self-pay

## 2018-03-23 ENCOUNTER — Ambulatory Visit (INDEPENDENT_AMBULATORY_CARE_PROVIDER_SITE_OTHER): Payer: Self-pay | Admitting: Physician Assistant

## 2018-03-23 ENCOUNTER — Encounter (INDEPENDENT_AMBULATORY_CARE_PROVIDER_SITE_OTHER): Payer: Self-pay | Admitting: Physician Assistant

## 2018-03-23 VITALS — BP 129/83 | HR 82 | Temp 98.3°F | Ht 74.0 in | Wt 220.2 lb

## 2018-03-23 DIAGNOSIS — R109 Unspecified abdominal pain: Secondary | ICD-10-CM

## 2018-03-23 DIAGNOSIS — R768 Other specified abnormal immunological findings in serum: Secondary | ICD-10-CM

## 2018-03-23 DIAGNOSIS — R3129 Other microscopic hematuria: Secondary | ICD-10-CM

## 2018-03-23 DIAGNOSIS — N1330 Unspecified hydronephrosis: Secondary | ICD-10-CM

## 2018-03-23 LAB — POCT URINALYSIS DIPSTICK
BILIRUBIN UA: NEGATIVE
GLUCOSE UA: NEGATIVE
KETONES UA: NEGATIVE
Leukocytes, UA: NEGATIVE
Nitrite, UA: NEGATIVE
Protein, UA: NEGATIVE
Spec Grav, UA: <=1.005 — AB (ref 1.010–1.025)
Urobilinogen, UA: 0.2 E.U./dL
pH, UA: 7 (ref 5.0–8.0)

## 2018-03-23 NOTE — Patient Instructions (Signed)
Hydronephrosis Hydronephrosis is the enlargement of a kidney due to a blockage that stops urine from flowing out of the body. What are the causes? Common causes of this condition include:  A birth (congenital) defect of the kidney.  A congenital defect of the tube through which urine travels (ureter).  Kidney stones.  An enlarged prostate gland.  A tumor.  Cancer of the prostate, bladder, uterus, ovary, or colon.  A blood clot.  What are the signs or symptoms? Symptoms of this condition include:  Pain or discomfort in your side (flank).  Swelling of the abdomen.  Pain in the abdomen.  Nausea and vomiting.  Fever.  Pain while passing urine.  Feeling of urgency to urinate.  Frequent urination.  Infection of the urinary tract.  In some cases, there are no symptoms. How is this diagnosed? This condition may be diagnosed with:  A medical history.  A physical exam.  Blood and urine tests to check kidney function.  Imaging tests, such as an X-ray, ultrasound, CT scan, or MRI.  A test in which a rigid or flexible telescope (cystoscope) is used to view the site of the blockage.  How is this treated? Treatment for this condition depends on where the blockage is located, how long it has been there, and what caused it. The goal of treatment is to remove the blockage. Treatment options include:  A procedure to put in a soft tube to help drain urine.  Antibiotic medicines to treat or prevent infection.  Shock-wave therapy (lithotripsy) to help eliminate kidney stones.  Follow these instructions at home:  Get lots of rest.  Drink enough fluid to keep your urine clear or pale yellow.  If you have a drain in, follow your health care provider's instructions about how to care for it.  Take medicines only as directed by your health care provider.  If you were prescribed an antibiotic medicine, finish all of it even if you start to feel better.  Keep all  follow-up visits as directed by your health care provider. This is important. Contact a health care provider if:  You continue to have symptoms after treatment.  You develop new symptoms.  You have a problem with a drainage device.  Your urine becomes cloudy or bloody.  You have a fever. Get help right away if:  You have severe flank or abdominal pain.  You develop vomiting and are unable to keep fluids down. This information is not intended to replace advice given to you by your health care provider. Make sure you discuss any questions you have with your health care provider. Document Released: 04/26/2007 Document Revised: 12/05/2015 Document Reviewed: 06/25/2014 Elsevier Interactive Patient Education  [employer].

## 2018-03-23 NOTE — Progress Notes (Signed)
Subjective:  Patient ID: Haley Chandler, female    DOB: [birth year]  Age: 47 y.o. MRN: [id number]  CC: f/u positive ANA  HPI Lupe Ineduis a 47 y.o.femalewith a medical history of cholecystectomy, GERD,and GAD presentswith persistent left flank pain, "hot" urine, foamy urine, urinary frequency, and left lower back pain. Work up revealed mild hydronephrosis of right kidney with simple right renal cyst. ANA positive with RNP and SSB Ab detected. We tried to get patient to [facility] Rheumatology since patient has CAFA but Rheumatology has denied her case without mention as to why. Calls have been made and staff messaging has been made but rheumatogy does not respond. Says she has tried to go to [facility] to see a rheumatologist but was denied due to lack of insurance. Seems she has not seen a [facility] PCP to try to get her enrolled into the [facility] charity care with a rheumatologist. Pt ineligible for medicaid as she does not have a SSN. Ineligible for SSN as she is here on overstayed visa. Does not endorse any other symptoms or complaints.    Outpatient Medications Prior to Visit  Medication Sig Dispense Refill  . DULoxetine (CYMBALTA) 30 MG capsule Take 1 capsule (30 mg total) by mouth daily. 30 capsule 5  . valACYclovir (VALTREX) 1000 MG tablet Take 1,000 mg by mouth daily.  99  . meclizine (ANTIVERT) 25 MG tablet Take 1 tablet (25 mg total) by mouth 3 (three) times daily as needed for dizziness. 30 tablet 0  . tamsulosin (FLOMAX) 0.4 MG CAPS capsule Take 1 capsule (0.4 mg total) by mouth daily. 30 capsule 1   Facility-Administered Medications Prior to Visit  Medication Dose Route Frequency Provider Last Rate Last Dose  . 0.9 %  sodium chloride infusion  500 mL Intravenous Continuous Irene Shipper, MD         ROS Review of Systems  Constitutional: Negative for chills, fever and malaise/fatigue.  Eyes: Negative for blurred vision.  Respiratory: Negative for shortness of breath.    Cardiovascular: Negative for chest pain and palpitations.  Gastrointestinal: Positive for abdominal pain (left flank). Negative for nausea.  Genitourinary: Negative for dysuria and hematuria.       Frothy urine  Musculoskeletal: Negative for joint pain and myalgias.  Skin: Negative for rash.  Neurological: Negative for tingling and headaches.  Psychiatric/Behavioral: Negative for depression. The patient is not nervous/anxious.     Objective:  BP 129/83 (BP Location: Right Arm, Patient Position: Sitting, Cuff Size: Large)   Pulse 82   Temp 98.3 F (36.8 C) (Oral)   Ht 6\' 2"  (1.88 m)   Wt 220 lb 3.2 oz (99.9 kg)   LMP 03/11/2018 (Approximate)   SpO2 100%   BMI 28.27 kg/m   BP/Weight 03/23/2018 11/06/8339 03/18/2228  Systolic BP 798 921 194  Diastolic BP 83 84 62  Wt. (Lbs) 220.2 216.2 -  BMI 28.27 27.76 -      Physical Exam  Constitutional: She is oriented to person, place, and time.  Well developed, well nourished, NAD, polite  HENT:  Head: Normocephalic and atraumatic.  Eyes: No scleral icterus.  Neck: Normal range of motion. Neck supple. No thyromegaly present.  Cardiovascular: Normal rate, regular rhythm and normal heart sounds.  Pulmonary/Chest: Effort normal and breath sounds normal.  Abdominal: Soft. Bowel sounds are normal. There is tenderness (left flank TTP).  Musculoskeletal: She exhibits no edema.  Neurological: She is alert and oriented to person, place, and time.  Skin: Skin is warm and dry. No rash noted. No erythema. No pallor.  Psychiatric: She has a normal mood and affect. Her behavior is normal. Thought content normal.  Vitals reviewed.    Assessment & Plan:    1. Hydronephrosis, unspecified hydronephrosis type - Ambulatory referral to Urology - Ambulatory referral to Rheumatology. Attached comment to referral order asking to send patient to a private rheumatology practice. I have also advised pt to speak to a PCP at [facility] to try to get her  enrolled into the [facility] charity program for rheumatology - Urinalysis Dipstick TI blood  2. Left flank pain - Ambulatory referral to Urology - Ambulatory referral to Rheumatology Attached comment to referral order asking to send patient to a private rheumatology practice. I have also advised pt to speak to a PCP at [facility] to try to get her enrolled into the [facility] charity program for rheumatology. - Urinalysis Dipstick TI blood  3. Positive ANA (antinuclear antibody) - Ambulatory referral to Rheumatology. Attached comment to referral order asking to send patient to a private rheumatology practice. I have also advised pt to speak to a PCP at [facility] to try to get her enrolled into the [facility] charity program for rheumatology. - Urinalysis Dipstick TI blood  4. Microscopic hematuria - Trace intact on UA. May be due to autoimmune disorder. Unable thus far to get patient to rheumatologist, see HPI.  - Ambulatory referral to urologist.    Follow-up: Return in about 4 weeks (around 04/20/2018) for hydronephrosis, urology and rhuematology f/u.   Clent Demark PA

## 2018-03-24 ENCOUNTER — Telehealth (INDEPENDENT_AMBULATORY_CARE_PROVIDER_SITE_OTHER): Payer: Self-pay | Admitting: Physician Assistant

## 2018-03-24 NOTE — Telephone Encounter (Signed)
CMA called MD. Estanislado Pandy office and was able to set up an appointment on Dec 12 @ 9:45 am. Referral coordinator for MD. Estanisl[facility] Pandy made aware that they have a no show/cancellation policy. If patient no shows or doesn't call within 24 hrs to cancel her appointment they are going to charge $50. Referral coordinator also made aware that patient visit can last from 30 minutes up to 2 hours.   Front desk called patient to inform her of her appointment on 12-12 @ 9:45 and also about their no show/cancelation policy. Patient was told that they would charge a $50 if she did not follow their policy, patient was understanding and stated "no problem thats okay". Front desk told patient she would sent out a letter with the address to Dr. Estanislado Pandy office but patient stated she was moving and will call back to give the correct address as soon as she settles in.   Thank you Emmit Pomfret

## 2018-03-25 NOTE — Telephone Encounter (Signed)
FWD to PCP just to inform. Nat Christen, CMA

## 2018-03-25 NOTE — Telephone Encounter (Signed)
Noted! Thank you

## 2018-04-06 ENCOUNTER — Other Ambulatory Visit (HOSPITAL_COMMUNITY)
Admission: RE | Admit: 2018-04-06 | Discharge: 2018-04-06 | Disposition: A | Discharge status: Home / Self Care | Payer: Self-pay | Attending: Physician Assistant | Admitting: Physician Assistant

## 2018-04-06 ENCOUNTER — Ambulatory Visit (INDEPENDENT_AMBULATORY_CARE_PROVIDER_SITE_OTHER): Payer: Self-pay | Admitting: Urology

## 2018-04-06 DIAGNOSIS — N1330 Unspecified hydronephrosis: Secondary | ICD-10-CM | POA: Insufficient documentation

## 2018-04-06 LAB — BASIC METABOLIC PANEL
Anion gap: 8 (ref 5–15)
BUN: 16 mg/dL (ref 6–20)
CHLORIDE: 107 mmol/L (ref 98–111)
CO2: 23 mmol/L (ref 22–32)
CREATININE: 0.9 mg/dL (ref 0.44–1.00)
Calcium: 9.3 mg/dL (ref 8.9–10.3)
GFR calc Af Amer: >60 mL/min (ref >60)
Glucose, Bld: 92 mg/dL (ref 70–99)
Potassium: 3.9 mmol/L (ref 3.5–5.1)
SODIUM: 138 mmol/L (ref 135–145)

## 2018-04-06 MED FILL — ?TAMSULOSIN HCL 0.4 MG CAP: 0.4 | 30 days supply | Qty: 30 | Fill #0

## 2018-04-06 MED FILL — DULoxetine HCL 30 MG CPEP: 30 | 30 days supply | Qty: 30 | Fill #1

## 2018-04-06 MED FILL — valACYclovir HCL 1 GM TABS: 1 | 5 days supply | Qty: 5 | Fill #2

## 2018-04-21 ENCOUNTER — Other Ambulatory Visit: Payer: Self-pay | Admitting: Urology

## 2018-04-21 DIAGNOSIS — R310 Gross hematuria: Secondary | ICD-10-CM

## 2018-04-28 ENCOUNTER — Ambulatory Visit (HOSPITAL_COMMUNITY)
Admission: RE | Admit: 2018-04-28 | Discharge: 2018-04-28 | Disposition: A | Discharge status: Home / Self Care | Payer: Self-pay | Source: Ambulatory Visit | Attending: Urology | Admitting: Urology

## 2018-04-28 DIAGNOSIS — R311 Benign essential microscopic hematuria: Secondary | ICD-10-CM | POA: Insufficient documentation

## 2018-04-28 DIAGNOSIS — R310 Gross hematuria: Secondary | ICD-10-CM

## 2018-04-28 DIAGNOSIS — N1330 Unspecified hydronephrosis: Secondary | ICD-10-CM | POA: Insufficient documentation

## 2018-04-28 MED ORDER — IOHEXOL 300 MG/ML  SOLN
100.0000 mL | Freq: Once | INTRAMUSCULAR | Status: AC | PRN
  Administered 2018-04-28: 100 mL via INTRAVENOUS

## 2018-05-06 ENCOUNTER — Ambulatory Visit (INDEPENDENT_AMBULATORY_CARE_PROVIDER_SITE_OTHER): Payer: Self-pay

## 2018-05-06 DIAGNOSIS — Z23 Encounter for immunization: Secondary | ICD-10-CM

## 2018-05-09 ENCOUNTER — Ambulatory Visit (INDEPENDENT_AMBULATORY_CARE_PROVIDER_SITE_OTHER): Payer: Self-pay | Admitting: Physician Assistant

## 2018-05-09 VITALS — Temp 98.8°F

## 2018-05-09 DIAGNOSIS — Z111 Encounter for screening for respiratory tuberculosis: Secondary | ICD-10-CM

## 2018-05-09 MED FILL — DULoxetine HCL 30 MG CPEP: 30 | 30 days supply | Qty: 30 | Fill #2

## 2018-05-09 NOTE — Progress Notes (Signed)
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 Patient requested refill on Cymbalta. Patient currently has 3 refills on file with [facility] pharmacy Patient is aware.

## 2018-05-12 ENCOUNTER — Encounter (INDEPENDENT_AMBULATORY_CARE_PROVIDER_SITE_OTHER): Payer: Self-pay

## 2018-05-12 ENCOUNTER — Ambulatory Visit (INDEPENDENT_AMBULATORY_CARE_PROVIDER_SITE_OTHER): Payer: Self-pay | Admitting: Physician Assistant

## 2018-05-12 DIAGNOSIS — Z111 Encounter for screening for respiratory tuberculosis: Secondary | ICD-10-CM

## 2018-05-12 DIAGNOSIS — R7611 Nonspecific reaction to tuberculin skin test without active tuberculosis: Secondary | ICD-10-CM

## 2018-05-12 NOTE — Progress Notes (Signed)
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 Pt came from [place] three years ago which is considered a high burden TB country. No symptoms no previous workup.

## 2018-05-12 NOTE — Patient Instructions (Signed)
Tuberculin Skin Test  Why am I having this test?  Tuberculosis (TB) is a bacterial infection caused by Mycobacterium tuberculosis. Most people who are exposed to these bacteria have a strong enough defense (immune) system to prevent the bacteria from causing TB and developing symptoms. Their bodies prevent the germs from being active and making them sick (latent TB infection).  However, if you have TB germs in your body and your immune system is weak, you can develop a TB infection. This can cause symptoms such as:  · Night sweats.  · Fever.  · Weakness.  · Weight loss.    A latent TB infection can also become active later in life if your immune system becomes weakened or compromised.  You may have this test if your health care provider suspects that you have TB. You may also have this test to screen for TB if you are at risk for getting the disease. Those at increased risk include:  · People who inject illegal drugs or share needles.  · People with HIV or other diseases that affect immunity.  · Health care workers.  · People who live in high-risk communities, such as homeless shelters, nursing homes, and correctional facilities.  · People who have been in contact with someone with TB.  · People from countries where TB is more common.    If you are in a high-risk group, your health care provider may wish to screen for TB more often. This can help prevent the spread of the disease. Sometimes TB screening is required when starting a new job, such as becoming a health care worker or a teacher. Colleges or universities may require it of new students.  What is being tested?  A tuberculin skin test is the main test used to check for exposure to the bacteria that can cause TB. The test checks for antibodies to the bacteria. Antibodies are proteins that your body produces to protect you from germs and other things that can make you sick.  Your health care provider will inject a solution known as PPD (purified  protein derivative) under the first layer of skin on your arm. This causes a blister-like bubble to form at the site. Your health care provider will then examine the site after a number of hours have passed to see if a reaction has occurred.  How do I prepare for this test?  There is no preparation required for this test.  What do the results mean?  Your test results will be reported as either negative or positive.  If the tuberculin skin test produces a negative result, it is likely that you do not have TB and have not been exposed to the TB bacteria.  If you or your health care provider suspects exposure, however, you may want to repeat the test a few weeks later. A blood test may also be used to check for TB. This is because you will not react to the tuberculin skin test until several weeks after exposure to TB bacteria.  If you test positive to the tuberculin skin test, it is likely that you have been exposed to TB bacteria. The test does not distinguish between an active and a latent TB infection.  A false-positive result can occur. A false-positive result for TB bacteria is incorrect because it indicates a condition or finding is present when it is not.  Talk to your health care provider to discuss your results, treatment options, and if necessary, the need for more tests.    It is your responsibility to obtain your test results. Ask the lab or department performing the test when and how you will get your results. Talk with your health care provider if you have any questions about your results.  Talk with your health care provider to discuss your results, treatment options, and if necessary, the need for more tests. Talk with your health care provider if you have any questions about your results.  This information is not intended to replace advice given to you by your health care provider. Make sure you discuss any questions you have with your health care provider.   Document Released: 04/08/2005 Document Revised: 03/01/2016 Document Reviewed: 10/23/2013  Elsevier Interactive Patient Education © 2018 Elsevier Inc.

## 2018-05-16 ENCOUNTER — Other Ambulatory Visit (INDEPENDENT_AMBULATORY_CARE_PROVIDER_SITE_OTHER): Payer: Self-pay | Admitting: Physician Assistant

## 2018-05-16 DIAGNOSIS — A159 Respiratory tuberculosis unspecified: Secondary | ICD-10-CM

## 2018-05-16 LAB — QUANTIFERON-TB GOLD PLUS
QUANTIFERON TB1 AG VALUE: 1.78 [IU]/mL
QUANTIFERON-TB GOLD PLUS: POSITIVE — AB
QuantiFERON Nil Value: 0.19 IU/mL
QuantiFERON TB2 Ag Value: 1.99 IU/mL

## 2018-05-17 ENCOUNTER — Telehealth (INDEPENDENT_AMBULATORY_CARE_PROVIDER_SITE_OTHER): Payer: Self-pay

## 2018-05-17 NOTE — Telephone Encounter (Signed)
-----   Message from Clent Demark, PA-C sent at 05/16/2018  5:47 PM EST ----- Pt has TB infection. I have made referral to infectious disease. Pt should quarantine herself should she begin coughing as she could be contagious.

## 2018-05-17 NOTE — Telephone Encounter (Signed)
Yes, go have XR done.

## 2018-05-17 NOTE — Telephone Encounter (Signed)
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 Referral has been placed to infectious disease. Advised patient to quarantine herself should she begin coughing as she could be contagious. Patient is asking should she still go and have XR done. Please advise. Nat Christen, CMA

## 2018-05-18 NOTE — Telephone Encounter (Signed)
Patient is aware that she still needs to have chest XR performed. Nat Christen, CMA

## 2018-05-24 ENCOUNTER — Encounter: Payer: Self-pay | Admitting: Infectious Diseases

## 2018-05-24 ENCOUNTER — Ambulatory Visit (INDEPENDENT_AMBULATORY_CARE_PROVIDER_SITE_OTHER): Payer: Self-pay | Admitting: Infectious Diseases

## 2018-05-24 DIAGNOSIS — Z227 Latent tuberculosis: Secondary | ICD-10-CM

## 2018-05-24 NOTE — Progress Notes (Signed)
   Subjective:    Patient ID: Haley Chandler, female    DOB: 20-Sep-1970, 47 y.o.   MRN: [id number]  HPI 47 yo F with hx of immigration from [place], came to [place] 2016 years ago.  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 This site remains red and itchy. She also has a + quantiferon gold. She got BCG as a child.  She has been asx. She had a recent CT abd/pelvis that was (-).  She had w/u due to hematuria, kidney stones. States she has Uro appt pending.  No hx of TB exposures (no Fhx). Her sister also had PPD+ and had skin rash, pruritis. Sister told her not to come to appt.   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   Review of Systems  Constitutional: Negative for chills, fever and unexpected weight change.  Respiratory: Negative for cough.   Cardiovascular: Negative for chest pain.  Gastrointestinal: Negative for constipation and diarrhea.  Genitourinary: Positive for hematuria. Negative for difficulty urinating.  Musculoskeletal: Positive for arthralgias. Negative for joint swelling.  Hematological: Negative for adenopathy.  shoulder pain when sleeping.  Please see HPI. All other systems reviewed and negative.  The past medical history, family history and social history were reviewed/updated in EPIC     Objective:   Physical Exam  Constitutional: She is oriented to person, place, and time. She appears well-developed and well-nourished.  HENT:  Mouth/Throat: No oropharyngeal exudate.  Eyes: Pupils are equal, round, and reactive to light. EOM are normal.  Neck: Normal range of motion. Neck supple.  Cardiovascular: Normal rate, regular rhythm and normal heart sounds.  Pulmonary/Chest: Effort normal and breath sounds normal.  Abdominal: Soft. Bowel sounds are normal. She exhibits no distension. There is no tenderness.  Musculoskeletal: She exhibits no edema.  Lymphadenopathy:    She has no cervical adenopathy.   She has no axillary adenopathy.       Right: No supraclavicular adenopathy present.       Left: No supraclavicular adenopathy present.  Neurological: She is alert and oriented to person, place, and time.  Skin: Skin is warm and dry.       Assessment & Plan:

## 2018-05-24 NOTE — Assessment & Plan Note (Signed)
I explained that given her country of origin, her hx of BCG, it is difficult to know if she was exposed and is latent carrier. The standard would be to give her tx for latent TB provided her CXR is (-).  Will check her CXR now, check HIV and hepatitis panel.  Will call her back with result. If negative, 2 months of rifampin.  Explained that this is a different rx that her sister received (most likely INH, B6).  Will see her back in 4-6 weeks.

## 2018-05-25 LAB — COMPREHENSIVE METABOLIC PANEL
AG RATIO: 1.3 (calc) (ref 1.0–2.5)
ALBUMIN MSPROF: 4.2 g/dL (ref 3.6–5.1)
ALT: 19 U/L (ref 6–29)
AST: 17 U/L (ref 10–35)
Alkaline phosphatase (APISO): 32 U/L — ABNORMAL LOW (ref 33–115)
BILIRUBIN TOTAL: 0.8 mg/dL (ref 0.2–1.2)
BUN: 13 mg/dL (ref 7–25)
CALCIUM: 9.7 mg/dL (ref 8.6–10.2)
CO2: 27 mmol/L (ref 20–32)
Chloride: 105 mmol/L (ref 98–110)
Creat: 0.93 mg/dL (ref 0.50–1.10)
Globulin: 3.3 g/dL (calc) (ref 1.9–3.7)
Glucose, Bld: 80 mg/dL (ref 65–99)
POTASSIUM: 4.5 mmol/L (ref 3.5–5.3)
SODIUM: 138 mmol/L (ref 135–146)
Total Protein: 7.5 g/dL (ref 6.1–8.1)

## 2018-05-25 LAB — CBC
HCT: 36.4 % (ref 35.0–45.0)
Hemoglobin: 12.3 g/dL (ref 11.7–15.5)
MCH: 31.1 pg (ref 27.0–33.0)
MCHC: 33.8 g/dL (ref 32.0–36.0)
MCV: 92.2 fL (ref 80.0–100.0)
MPV: 10.6 fL (ref 7.5–12.5)
Platelets: 337 10*3/uL (ref 140–400)
RBC: 3.95 10*6/uL (ref 3.80–5.10)
RDW: 11.6 % (ref 11.0–15.0)
WBC: 4 10*3/uL (ref 3.8–10.8)

## 2018-05-25 LAB — HEPATITIS A ANTIBODY, TOTAL: HEPATITIS A AB,TOTAL: REACTIVE — AB

## 2018-05-25 LAB — HEPATITIS B SURFACE ANTIBODY,QUALITATIVE: Hep B S Ab: REACTIVE — AB

## 2018-05-25 LAB — HIV ANTIBODY (ROUTINE TESTING W REFLEX): HIV: NONREACTIVE

## 2018-05-25 LAB — HEPATITIS C ANTIBODY
HEP C AB: NONREACTIVE
SIGNAL TO CUT-OFF: 0.03 (ref <1.00)

## 2018-05-25 LAB — HEPATITIS B SURFACE ANTIGEN: Hepatitis B Surface Ag: NONREACTIVE

## 2018-05-31 ENCOUNTER — Telehealth: Payer: Self-pay

## 2018-05-31 NOTE — Telephone Encounter (Signed)
Called patient to inform her she would be able to to have chest x-ray done at [facility]. Patient verbalized understanding and will go tomorrow 11/20 to have xray done. [place]

## 2018-05-31 NOTE — Telephone Encounter (Signed)
-----   Message from Campbell Riches, MD sent at 05/31/2018  8:50 AM EST ----- Can you call her and find out if she has had CXR? If not, please schedule  thanks

## 2018-05-31 NOTE — Telephone Encounter (Signed)
Per Dr. Johnnye Sima called patient to see if she had x-ray done. Patient states she is unable to have Chest X-ray done since she only has orange card. Denies secondary insurance. Patient it not sure when she will be able to have x-ray done at the moment.  [place]

## 2018-06-01 ENCOUNTER — Ambulatory Visit (INDEPENDENT_AMBULATORY_CARE_PROVIDER_SITE_OTHER): Payer: Self-pay | Admitting: Urology

## 2018-06-01 ENCOUNTER — Ambulatory Visit (HOSPITAL_COMMUNITY)
Admission: RE | Admit: 2018-06-01 | Discharge: 2018-06-01 | Disposition: A | Discharge status: Home / Self Care | Payer: Self-pay | Source: Ambulatory Visit | Attending: Infectious Diseases | Admitting: Infectious Diseases

## 2018-06-01 ENCOUNTER — Other Ambulatory Visit (INDEPENDENT_AMBULATORY_CARE_PROVIDER_SITE_OTHER): Payer: Self-pay | Admitting: Physician Assistant

## 2018-06-01 DIAGNOSIS — Z227 Latent tuberculosis: Secondary | ICD-10-CM | POA: Insufficient documentation

## 2018-06-01 DIAGNOSIS — I517 Cardiomegaly: Secondary | ICD-10-CM | POA: Insufficient documentation

## 2018-06-01 DIAGNOSIS — N1330 Unspecified hydronephrosis: Secondary | ICD-10-CM

## 2018-06-01 DIAGNOSIS — R3915 Urgency of urination: Secondary | ICD-10-CM

## 2018-06-01 NOTE — Telephone Encounter (Signed)
FWD to PCP. Declin Rajan S Laiah Pouncey, CMA  

## 2018-06-02 ENCOUNTER — Telehealth: Payer: Self-pay | Admitting: Infectious Diseases

## 2018-06-02 DIAGNOSIS — Z227 Latent tuberculosis: Secondary | ICD-10-CM

## 2018-06-02 MED ORDER — RIFAMPIN 300 MG PO CAPS: 600.0000 mg | ORAL_CAPSULE | Freq: Every day | ORAL | 3 refills | Status: DC

## 2018-06-02 NOTE — Telephone Encounter (Signed)
CXR (-) Will start LTBI rx Pt called rifampin 10mg /kg x 4 mos (pt is 228#) [facility] to [facility]

## 2018-06-02 NOTE — Telephone Encounter (Signed)
-----   Message from Campbell Riches, MD sent at 05/24/2018  5:10 PM EST ----- Call with CXR  Start LTBI rx.

## 2018-06-03 ENCOUNTER — Telehealth (INDEPENDENT_AMBULATORY_CARE_PROVIDER_SITE_OTHER): Payer: Self-pay | Admitting: Physician Assistant

## 2018-06-03 NOTE — Telephone Encounter (Signed)
Open in error

## 2018-06-06 ENCOUNTER — Ambulatory Visit (INDEPENDENT_AMBULATORY_CARE_PROVIDER_SITE_OTHER): Payer: Self-pay | Admitting: Physician Assistant

## 2018-06-13 NOTE — Progress Notes (Deleted)
Office Visit Note  Patient: Haley Chandler             Date of Birth: Mar 04, 1971           MRN: [id number]             PCP: Clent Demark, PA-C Referring: Clent Demark, PA-C Visit Date: 06/23/2018 Occupation: @GUAROCC @  Subjective:  No chief complaint on file.   History of Present Illness: Haley Chandler is a 47 y.o. female ***   Activities of Daily Living:  Patient reports morning stiffness for *** {minute/hour:19697}.   Patient {ACTIONS;DENIES/REPORTS:21021675::"Denies"} nocturnal pain.  Difficulty dressing/grooming: {ACTIONS;DENIES/REPORTS:21021675::"Denies"} Difficulty climbing stairs: {ACTIONS;DENIES/REPORTS:21021675::"Denies"} Difficulty getting out of chair: {ACTIONS;DENIES/REPORTS:21021675::"Denies"} Difficulty using hands for taps, buttons, cutlery, and/or writing: {ACTIONS;DENIES/REPORTS:21021675::"Denies"}  No Rheumatology ROS completed.   PMFS History:  Patient Active Problem List   Diagnosis Date Noted  . 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 Symptomatic cholelithiasis 12/05/2016  . Acute cholecystitis 12/04/2016    Past Medical History:  Diagnosis Date  . GERD (gastroesophageal reflux disease)     Family History  Problem Relation Age of Onset  . Hypertension Mother   . Glaucoma Mother   . Hyperlipidemia Mother   . Diabetes Mother   . CAD Father        died of MI without prior hx.   . Positive PPD/TB Exposure Sister    Past Surgical History:  Procedure Laterality Date  . CHOLECYSTECTOMY N/A 12/05/2016   Procedure: LAPAROSCOPIC CHOLECYSTECTOMY WITH INTRAOPERATIVE CHOLANGIOGRAM;  Surgeon: Judeth Horn, MD;  Location: [facility];  Service: General;  Laterality: N/A;  . TUBAL LIGATION     Social History   Social History Narrative  . Not on file    Objective: Vital Signs: There were no vitals taken for this visit.   Physical Exam   Musculoskeletal Exam: ***  CDAI Exam: CDAI Score: Not documented Patient Global Assessment: Not documented; Provider  Global Assessment: Not documented Swollen: Not documented; Tender: Not documented Joint Exam   Not documented   There is currently no information documented on the homunculus. Go to the Rheumatology activity and complete the homunculus joint exam.  Investigation: No additional findings.  Imaging: Dg Chest 2 View  Result Date: 06/01/2018 CLINICAL DATA:  Positive TB skin test, short of breath, chest congestion EXAM: CHEST - 2 VIEW COMPARISON:  Chest x-ray of 09/01/2016 FINDINGS: No active infiltrate or effusion is seen. Mediastinal and hilar contours are unremarkable. The heart is mildly enlarged. No acute bony abnormality is seen. IMPRESSION: Mild cardiomegaly.  No active lung disease. Electronically Signed   By: Ivar Drape M.D.   On: 06/01/2018 14:49    Recent Labs: Lab Results  Component Value Date   WBC 4.0 05/24/2018   HGB 12.3 05/24/2018   PLT 337 05/24/2018   NA 138 05/24/2018   K 4.5 05/24/2018   CL 105 05/24/2018   CO2 27 05/24/2018   GLUCOSE 80 05/24/2018   BUN 13 05/24/2018   CREATININE 0.93 05/24/2018   BILITOT 0.8 05/24/2018   ALKPHOS 32 (L) 01/12/2018   AST 17 05/24/2018   ALT 19 05/24/2018   PROT 7.5 05/24/2018   ALBUMIN 4.2 01/12/2018   CALCIUM 9.7 05/24/2018   GFRAA >60 04/06/2018   QFTBGOLDPLUS Positive (A) 05/12/2018    Speciality Comments: No specialty comments available.  Procedures:  No procedures performed Allergies: Patient has no known allergies.   Assessment / Plan:     Visit Diagnoses: Positive ANA (antinuclear antibody)  History of  hydronephrosis  Left flank pain  History of gastroesophageal reflux (GERD)  TB lung, latent   Orders: No orders of the defined types were placed in this encounter.  No orders of the defined types were placed in this encounter.   Face-to-face time spent with patient was *** minutes. Greater than 50% of time was spent in counseling and coordination of care.  Follow-Up Instructions: No follow-ups on  file.   Ofilia Neas, PA-C  Note - This record has been created using Dragon software.  Chart creation errors have been sought, but may not always  have been located. Such creation errors do not reflect on  the standard of medical care.

## 2018-06-14 MED FILL — rifAMPin 300 MG CAPS: 300 | 30 days supply | Qty: 60 | Fill #0

## 2018-06-14 MED FILL — DULoxetine HCL 30 MG CPEP: 30 | 30 days supply | Qty: 30 | Fill #3

## 2018-06-15 ENCOUNTER — Encounter (INDEPENDENT_AMBULATORY_CARE_PROVIDER_SITE_OTHER): Payer: Self-pay | Admitting: Physician Assistant

## 2018-06-15 ENCOUNTER — Ambulatory Visit (INDEPENDENT_AMBULATORY_CARE_PROVIDER_SITE_OTHER): Payer: Self-pay | Admitting: Physician Assistant

## 2018-06-15 VITALS — BP 119/77 | HR 83 | Temp 98.1°F | Resp 16 | Wt 230.8 lb

## 2018-06-15 DIAGNOSIS — B009 Herpesviral infection, unspecified: Secondary | ICD-10-CM

## 2018-06-15 MED ORDER — VALACYCLOVIR HCL 1 G PO TABS: 1000.0000 mg | ORAL_TABLET | Freq: Every day | ORAL | 99 refills | Status: DC

## 2018-06-15 MED FILL — ?VALACYCLOVIR HCL 1 GRAM TA: 1 | 30 days supply | Qty: 30 | Fill #0

## 2018-06-15 NOTE — Patient Instructions (Signed)

## 2018-06-15 NOTE — Progress Notes (Signed)
Subjective:  Patient ID: Haley Chandler, female    DOB: [birth year]  Age: 47 y.o. MRN: [id number]  CC: medication refill  HPI Haley Chandler a 47 y.o.femalewith a medical history of cholecystectomy, GERD,HSV2, autoimmune disease, and GAD presents for Valtrex refill. Pt was told by pharmacy that her prescription for Valtrex has expired. Epic shows she was prescribed Valtrex 1000 mg, one tab po, qday with PRN refills on 11/08/17. Has taken Valtrex with clearance of outbreak "within a few days", only to have another outbreak in a week. Pt has begun to see rheumatology for positive ANA with positive RNP and SSA antibodies. Unclear if she has started treatment and what her diagnosis is at this point. Thinks she has an appointment in the coming weeks. No notes available for review. Does not endorse any other symptoms.    Outpatient Medications Prior to Visit  Medication Sig Dispense Refill  . DULoxetine (CYMBALTA) 30 MG capsule Take 1 capsule (30 mg total) by mouth daily. 30 capsule 5  . rifampin (RIFADIN) 300 MG capsule Take 2 capsules (600 mg total) by mouth daily. 60 capsule 3  . valACYclovir (VALTREX) 1000 MG tablet Take 1,000 mg by mouth daily.  99   Facility-Administered Medications Prior to Visit  Medication Dose Route Frequency Provider Last Rate Last Dose  . 0.9 %  sodium chloride infusion  500 mL Intravenous Continuous Irene Shipper, MD         ROS Review of Systems  Constitutional: Negative for chills, fever and malaise/fatigue.  Eyes: Negative for blurred vision.  Respiratory: Negative for shortness of breath.   Cardiovascular: Negative for chest pain and palpitations.  Gastrointestinal: Negative for abdominal pain and nausea.  Genitourinary: Negative for dysuria and hematuria.  Musculoskeletal: Negative for joint pain and myalgias.  Skin: Negative for rash.  Neurological: Negative for tingling and headaches.  Psychiatric/Behavioral: Negative for depression. The patient is not  nervous/anxious.     Objective:  BP 119/77   Pulse 83   Temp 98.1 F (36.7 C) (Oral)   Resp 16   Wt 230 lb 12.8 oz (104.7 kg)   SpO2 100%   BMI 29.63 kg/m   BP/Weight 06/15/2018 05/24/2018 07/18/2692  Systolic BP 854 627 035  Diastolic BP 77 85 83  Wt. (Lbs) 230.8 228 220.2  BMI 29.63 29.27 28.27      Physical Exam  Constitutional: She is oriented to person, place, and time.  Well developed, well nourished, NAD, polite  HENT:  Head: Normocephalic and atraumatic.  Eyes: No scleral icterus.  Neck: Normal range of motion.  Pulmonary/Chest: Effort normal.  Musculoskeletal: She exhibits no edema.  Neurological: She is alert and oriented to person, place, and time.  Skin: Skin is warm and dry. No rash noted. No erythema. No pallor.  Psychiatric: She has a normal mood and affect. Her behavior is normal. Thought content normal.  Vitals reviewed.    Assessment & Plan:    1. Herpes infection - Valtrex 1000 mg, one tablet po, qday, x30 days, #99 refills for suppressive therapy. Seems patient may have also been started on immunosuppressive therapy at rheumatology but no notes available.    Meds ordered this encounter  Medications  . DISCONTD: valACYclovir (VALTREX) 1000 MG tablet    Sig: Take 1 tablet (1,000 mg total) by mouth daily.    Dispense:  30 tablet    Refill:  99    Order Specific Question:   Supervising Provider    Answer:   Margarita Rana,  ENOBONG [[id number]]  . valACYclovir (VALTREX) 1000 MG tablet    Sig: Take 1 tablet (1,000 mg total) by mouth daily.    Dispense:  30 tablet    Refill:  99    Order Specific Question:   Supervising Provider    Answer:   Charlott Rakes [[id number]]    Follow-up: Return if symptoms worsen or fail to improve.   Clent Demark PA

## 2018-06-23 ENCOUNTER — Ambulatory Visit (INDEPENDENT_AMBULATORY_CARE_PROVIDER_SITE_OTHER): Payer: Self-pay | Admitting: Physician Assistant

## 2018-06-23 ENCOUNTER — Ambulatory Visit: Payer: Self-pay | Admitting: Rheumatology

## 2018-06-29 ENCOUNTER — Other Ambulatory Visit: Payer: Self-pay | Admitting: Family Medicine

## 2018-06-29 ENCOUNTER — Ambulatory Visit (INDEPENDENT_AMBULATORY_CARE_PROVIDER_SITE_OTHER): Payer: Self-pay | Admitting: Urology

## 2018-06-29 DIAGNOSIS — R3915 Urgency of urination: Secondary | ICD-10-CM

## 2018-06-29 DIAGNOSIS — R351 Nocturia: Secondary | ICD-10-CM

## 2018-06-29 MED ORDER — MIRABEGRON ER 50 MG PO TB24: 50.0000 mg | ORAL_TABLET | Freq: Every day | ORAL | 3 refills | Status: DC

## 2018-06-29 MED FILL — MYRBETRIQ ER 50 MG TABLET: 50 | 30 days supply | Qty: 30 | Fill #0

## 2018-06-29 NOTE — Progress Notes (Signed)
Myrbetriq ordered by Urology - Dr Alyson Ingles; will rewrite rx so it can be filled at [facility].

## 2018-07-14 NOTE — Progress Notes (Signed)
Office Visit Note  Patient: Haley Chandler             Date of Birth: October 14, 1970           MRN: [id number]             PCP: Clent Demark, PA-C Referring: Clent Demark, PA-C Visit Date: 07/25/2018 Occupation: unemployed  Subjective:  Joint pain and positive ANA.   History of Present Illness: Haley Chandler is a 48 y.o. female seen in consultation per request of her PCP.  According to patient her symptoms are started about 1 year ago with a left flank pain.  At the time she was diagnosed with a kidney stone and hydronephrosis.  She states after treatment the kidney pain is resolved.  She states she also has been experiencing some generalized pain.  She has pain in her neck and lower back.  She has pain sometimes in her shoulders, hips and her knee joints.  She has not noticed any joint swelling.  She states she is to experience pain in her feet which resolved after taking anxiety medication.  Patient states that there is positive family history of arthritis in her mother and sister.  Activities of Daily Living:  Patient reports morning stiffness for 0 minute.   Patient Reports nocturnal pain.  Difficulty dressing/grooming: Denies Difficulty climbing stairs: Denies Difficulty getting out of chair: Denies Difficulty using hands for taps, buttons, cutlery, and/or writing: Denies  Review of Systems  Constitutional: Positive for fatigue. Negative for night sweats, weight gain and weight loss.  HENT: Positive for mouth dryness. Negative for mouth sores, trouble swallowing, trouble swallowing and nose dryness.   Eyes: Positive for dryness. Negative for pain, redness and visual disturbance.  Respiratory: Negative for cough, shortness of breath and difficulty breathing.   Cardiovascular: Negative for chest pain, palpitations, hypertension, irregular heartbeat and swelling in legs/feet.  Gastrointestinal: Positive for constipation and heartburn. Negative for blood in stool and diarrhea.    Endocrine: Negative for increased urination.  Genitourinary: Negative for vaginal dryness.  Musculoskeletal: Positive for arthralgias, joint pain, myalgias and myalgias. Negative for joint swelling, muscle weakness, morning stiffness and muscle tenderness.  Skin: Positive for sensitivity to sunlight. Negative for color change, rash, hair loss, skin tightness and ulcers.       pruritis  Allergic/Immunologic: Negative for susceptible to infections.  Neurological: Negative for dizziness, memory loss, night sweats and weakness.  Hematological: Negative for swollen glands.  Psychiatric/Behavioral: Positive for sleep disturbance. Negative for depressed mood. The patient is nervous/anxious.     PMFS History:  Patient Active Problem List   Diagnosis Date Noted  . 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 Symptomatic cholelithiasis 12/05/2016  . Acute cholecystitis 12/04/2016    Past Medical History:  Diagnosis Date  . GERD (gastroesophageal reflux disease)     Family History  Problem Relation Age of Onset  . Hypertension Mother   . Glaucoma Mother   . Hyperlipidemia Mother   . Diabetes Mother   . CAD Father        died of MI without prior hx.   . Diabetes Sister   . Healthy Son   . Healthy Son    Past Surgical History:  Procedure Laterality Date  . CHOLECYSTECTOMY N/A 12/05/2016   Procedure: LAPAROSCOPIC CHOLECYSTECTOMY WITH INTRAOPERATIVE CHOLANGIOGRAM;  Surgeon: Judeth Horn, MD;  Location: [place];  Service: General;  Laterality: N/A;  . TUBAL LIGATION     Social History   Social History Narrative  .  Not on file    Objective: Vital Signs: BP 137/83 (BP Location: Right Arm, Patient Position: Sitting, Cuff Size: Normal)   Pulse 70   Resp 15   Ht 6\' 2"  (1.88 m)   Wt 235 lb (106.6 kg)   BMI 30.17 kg/m    Physical Exam Vitals signs and nursing note reviewed.  Constitutional:      Appearance: She is well-developed.  HENT:     Head: Normocephalic and atraumatic.  Eyes:      Conjunctiva/sclera: Conjunctivae normal.  Neck:     Musculoskeletal: Normal range of motion.  Cardiovascular:     Rate and Rhythm: Normal rate and regular rhythm.     Heart sounds: Normal heart sounds.  Pulmonary:     Effort: Pulmonary effort is normal.     Breath sounds: Normal breath sounds.  Abdominal:     General: Bowel sounds are normal.     Palpations: Abdomen is soft.  Lymphadenopathy:     Cervical: No cervical adenopathy.  Skin:    General: Skin is warm and dry.     Capillary Refill: Capillary refill takes less than 2 seconds.  Neurological:     Mental Status: She is alert and oriented to person, place, and time.  Psychiatric:        Behavior: Behavior normal.      Musculoskeletal Exam: Patient has stiffness with range of motion of her cervical spine.  She had painful range of motion of bilateral shoulder joints.  Elbow joints wrist joints MCPs PIPs DIPs with good range of motion with no synovitis.  She has discomfort range of motion of her lumbar spine.  She has discomfort range of motion of bilateral hip joints.  Knee joints ankles MTPs PIPs with good range of motion with no synovitis.  CDAI Exam: CDAI Score: Not documented Patient Global Assessment: Not documented; Provider Global Assessment: Not documented Swollen: Not documented; Tender: Not documented Joint Exam   Not documented   There is currently no information documented on the homunculus. Go to the Rheumatology activity and complete the homunculus joint exam.  Investigation: Findings:  05/24/18: Hep C Ab negative, HIV-, Hep B S Ab reactive, Hep A Ab reactive, Hep B surface Ag negative, TB gold positive  01/12/18: dsDNA <1, RNP 1.8, smith <0.2, Ro 6.2, La 0.3, ANA +  Component     Latest Ref Rng & Units 05/24/2018  Hepatitis C Ab     NON-REACTI NON-REACTIVE  SIGNAL TO CUT-OFF     <1.00 0.03  HIV     NON-REACTI NON-REACTIVE  Hepatitis A AB,Total     NON-REACTI REACTIVE (A)  Hep B S Ab     NON-REACTI  REACTIVE (A)  Hepatitis B Surface Ag     NON-REACTI NON-REACTIVE   Component     Latest Ref Rng & Units 01/12/2018  dsDNA Ab     0 - 9 IU/mL <1  ENA RNP Ab     0.0 - 0.9 AI 1.8 (H)  ENA SM Ab Ser-aCnc     0.0 - 0.9 AI <0.2  ENA SSA (RO) Ab     0.0 - 0.9 AI 6.2 (H)  ENA SSB (LA) Ab     0.0 - 0.9 AI 0.3  SEE BELOW      Comment  Anti Nuclear Antibody(ANA)     Negative Positive (A)   Imaging: Xr Hips Bilat W Or W/o Pelvis 3-4 Views  Result Date: 07/25/2018 Mild superior lateral narrowing of bilateral hip  joints was noted.  No chondrocalcinosis was noted.  No SI joint to sclerosis was noted. 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  Xr Cervical Spine 2 Or 3 Views  Result Date: 07/25/2018 No significant disc space narrowing was noted.  Mild facet joint arthropathy was noted.  No syndesmophytes were noted.  Xr Lumbar Spine 2-3 Views  Result Date: 07/25/2018 No significant disc space narrowing was noted.  Facet joint arthropathy was noted. Impression: These findings are consistent with facet joint arthropathy.  Xr Shoulder Left  Result Date: 07/25/2018 High rising humerus was noted.  No glenohumeral or acromioclavicular joint narrowing was noted.  No chondrocalcinosis was noted.  Xr Shoulder Right  Result Date: 07/25/2018 High rising humerus was noted.  No glenohumeral or acromioclavicular joint narrowing was noted.  No chondrocalcinosis was noted.   Recent Labs: Lab Results  Component Value Date   WBC 4.0 05/24/2018   HGB 12.3 05/24/2018   PLT 337 05/24/2018   NA 138 05/24/2018   K 4.5 05/24/2018   CL 105 05/24/2018   CO2 27 05/24/2018   GLUCOSE 80 05/24/2018   BUN 13 05/24/2018   CREATININE 0.93 05/24/2018   BILITOT 0.8 05/24/2018   ALKPHOS 32 (L) 01/12/2018   AST 17 05/24/2018   ALT 19 05/24/2018   PROT 7.5 05/24/2018   ALBUMIN 4.2 01/12/2018   CALCIUM 9.7 05/24/2018   GFRAA >60 04/06/2018   QFTBGOLDPLUS Positive  (A) 05/12/2018    Speciality Comments: No specialty comments available.  Procedures:  No procedures performed Allergies: Patient has no known allergies.   Assessment / Plan:     Visit Diagnoses: Positive ANA (antinuclear antibody) - 05/24/18: Hep C Ab-, HIV-, Hep B S Ab reactive, Hep A Ab reactive, Hep B surface Ag-, TB gold+01/12/18: dsDNA <1, RNP 1.8, smith <0.2, Ro 6.2, La 0.3, ANA+.  Patient has positive ANA and positive RNP.  She has no clinical features of mixed connective tissue disease on examination.  I will obtain AVISE labs.  Pain, neck -she has been having pain and stiffness in her cervical spine.  Plan: XR Cervical Spine 2 or 3 views.  Mild facet joint arthropathy was noted.  A handout on neck exercises was given.  Chronic pain of both shoulders -painful range of motion of bilateral shoulders.  Plan: XR Shoulder Left, XR Shoulder Right.  Mild bilateral high rising humerus was noted.  No joint space narrowing was noted.  I offered physical therapy but patient declined.  A handout on shoulder exercises was given.  Chronic pain of both hips -she has painful range of motion bilateral hip joints.  No muscular weakness was noted.  She has no difficulty getting out of the chair.  Plan: XR HIPS BILAT W OR W/O PELVIS 3-4 VIEWS, mild hip joint narrowing was noted.  No chondrocalcinosis was noted.  Chronic midline low back pain without sciatica -patient complains of chronic lower back pain.  Plan: XR Lumbar Spine 2-3 Views.  Mild facet joint arthropathy was noted.  A handout on back exercises was given.  TB lung, latent - on Rifampin started 12/19  Other hydronephrosis  History of gastroesophageal reflux (GERD)   Orders: Orders Placed This Encounter  Procedures  . XR Cervical Spine 2 or 3 views  . XR Lumbar Spine 2-3 Views  . XR Shoulder Left  . XR Shoulder Right  . XR HIPS BILAT W OR W/O PELVIS 3-4 VIEWS   No orders of the defined types  were placed in this  encounter.   Face-to-face time spent with patient was 50 minutes. Greater than 50% of time was spent in counseling and coordination of care.  Follow-Up Instructions: Return for +ANA.   Bo Merino, MD  Note - This record has been created using Editor, commissioning.  Chart creation errors have been sought, but may not always  have been located. Such creation errors do not reflect on  the standard of medical care.

## 2018-07-18 ENCOUNTER — Ambulatory Visit: Payer: Self-pay | Attending: Family Medicine

## 2018-07-18 MED FILL — DULoxetine HCL 30 MG CPEP: 30 | 30 days supply | Qty: 30 | Fill #4

## 2018-07-18 MED FILL — rifAMPin 300 MG CAPS: 300 | 30 days supply | Qty: 60 | Fill #1

## 2018-07-25 ENCOUNTER — Ambulatory Visit (INDEPENDENT_AMBULATORY_CARE_PROVIDER_SITE_OTHER): Payer: Self-pay

## 2018-07-25 ENCOUNTER — Encounter: Payer: Self-pay | Admitting: Rheumatology

## 2018-07-25 ENCOUNTER — Ambulatory Visit (INDEPENDENT_AMBULATORY_CARE_PROVIDER_SITE_OTHER): Payer: Self-pay | Admitting: Rheumatology

## 2018-07-25 VITALS — BP 137/83 | HR 70 | Resp 15 | Ht 74.0 in | Wt 235.0 lb

## 2018-07-25 DIAGNOSIS — M545 Low back pain: Secondary | ICD-10-CM

## 2018-07-25 DIAGNOSIS — M25512 Pain in left shoulder: Secondary | ICD-10-CM

## 2018-07-25 DIAGNOSIS — Z227 Latent tuberculosis: Secondary | ICD-10-CM

## 2018-07-25 DIAGNOSIS — M25511 Pain in right shoulder: Secondary | ICD-10-CM

## 2018-07-25 DIAGNOSIS — M25551 Pain in right hip: Secondary | ICD-10-CM

## 2018-07-25 DIAGNOSIS — Z8719 Personal history of other diseases of the digestive system: Secondary | ICD-10-CM

## 2018-07-25 DIAGNOSIS — R768 Other specified abnormal immunological findings in serum: Secondary | ICD-10-CM

## 2018-07-25 DIAGNOSIS — N1339 Other hydronephrosis: Secondary | ICD-10-CM

## 2018-07-25 DIAGNOSIS — G8929 Other chronic pain: Secondary | ICD-10-CM

## 2018-07-25 DIAGNOSIS — M25552 Pain in left hip: Secondary | ICD-10-CM

## 2018-07-25 DIAGNOSIS — M542 Cervicalgia: Secondary | ICD-10-CM

## 2018-07-25 NOTE — Patient Instructions (Addendum)
Cervical Strain and Sprain Rehab Ask your health care provider which exercises are safe for you. Do exercises exactly as told by your health care provider and adjust them as directed. It is normal to feel mild stretching, pulling, tightness, or discomfort as you do these exercises, but you should stop right away if you feel sudden pain or your pain gets worse.Do not begin these exercises until told by your health care provider. Stretching and range of motion exercises These exercises warm up your muscles and joints and improve the movement and flexibility of your neck. These exercises also help to relieve pain, numbness, and tingling. Exercise A: Cervical side bend  1. Using good posture, sit on a stable chair or stand up. 2. Without moving your shoulders, slowly tilt your left / right ear to your shoulder until you feel a stretch in your neck muscles. You should be looking straight ahead. 3. Hold for __________ seconds. 4. Repeat with the other side of your neck. Repeat __________ times. Complete this exercise __________ times a day. Exercise B: Cervical rotation  1. Using good posture, sit on a stable chair or stand up. 2. Slowly turn your head to the side as if you are looking over your left / right shoulder. ? Keep your eyes level with the ground. ? Stop when you feel a stretch along the side and the back of your neck. 3. Hold for __________ seconds. 4. Repeat this by turning to your other side. Repeat __________ times. Complete this exercise __________ times a day. Exercise C: Thoracic extension and pectoral stretch 1. Roll a towel or a small blanket so it is about 4 inches (10 cm) in diameter. 2. Lie down on your back on a firm surface. 3. Put the towel lengthwise, under your spine in the middle of your back. It should not be not under your shoulder blades. The towel should line up with your spine from your middle back to your lower back. 4. Put your hands behind your head and let your  elbows fall out to your sides. 5. Hold for __________ seconds. Repeat __________ times. Complete this exercise __________ times a day. Strengthening exercises These exercises build strength and endurance in your neck. Endurance is the ability to use your muscles for a long time, even after your muscles get tired. Exercise D: Upper cervical flexion, isometric 1. Lie on your back with a thin pillow behind your head and a small rolled-up towel under your neck. 2. Gently tuck your chin toward your chest and nod your head down to look toward your feet. Do not lift your head off the pillow. 3. Hold for __________ seconds. 4. Release the tension slowly. Relax your neck muscles completely before you repeat this exercise. Repeat __________ times. Complete this exercise __________ times a day. Exercise E: Cervical extension, isometric  1. Stand about 6 inches (15 cm) away from a wall, with your back facing the wall. 2. Place a soft object, about 6-8 inches (15-20 cm) in diameter, between the back of your head and the wall. A soft object could be a small pillow, a ball, or a folded towel. 3. Gently tilt your head back and press into the soft object. Keep your jaw and forehead relaxed. 4. Hold for __________ seconds. 5. Release the tension slowly. Relax your neck muscles completely before you repeat this exercise. Repeat __________ times. Complete this exercise __________ times a day. Posture and body mechanics Body mechanics refers to the movements and positions of your   body while you do your daily activities. Posture is part of body mechanics. Good posture and healthy body mechanics can help to relieve stress in your body's tissues and joints. Good posture means that your spine is in its natural S-curve position (your spine is neutral), your shoulders are pulled back slightly, and your head is not tipped forward. The following are general guidelines for applying improved posture and body mechanics to your  everyday activities. Standing   When standing, keep your spine neutral and keep your feet about hip-width apart. Keep a slight bend in your knees. Your ears, shoulders, and hips should line up.  When you do a task in which you stand in one place for a long time, place one foot up on a stable object that is 2-4 inches (5-10 cm) high, such as a footstool. This helps keep your spine neutral. Sitting   When sitting, keep your spine neutral and your keep feet flat on the floor. Use a footrest, if necessary, and keep your thighs parallel to the floor. Avoid rounding your shoulders, and avoid tilting your head forward.  When working at a desk or a computer, keep your desk at a height where your hands are slightly lower than your elbows. Slide your chair under your desk so you are close enough to maintain good posture.  When working at a computer, place your monitor at a height where you are looking straight ahead and you do not have to tilt your head forward or downward to look at the screen. Resting When lying down and resting, avoid positions that are most painful for you. Try to support your neck in a neutral position. You can use a contour pillow or a small rolled-up towel. Your pillow should support your neck but not push on it. This information is not intended to replace advice given to you by your health care provider. Make sure you discuss any questions you have with your health care provider. Document Released: 06/29/2005 Document Revised: 03/05/2016 Document Reviewed: 06/05/2015 [employer] Interactive Patient Education  2019 [employer] Shoulder Exercises Ask your health care provider which exercises are safe for you. Do exercises exactly as told by your health care provider and adjust them as directed. It is normal to feel mild stretching, pulling, tightness, or discomfort as you do these exercises, but you should stop right away if you feel sudden pain or your pain gets worse.Do not begin  these exercises until told by your health care provider. Range of Motion Exercises        These exercises warm up your muscles and joints and improve the movement and flexibility of your shoulder. These exercises also help to relieve pain, numbness, and tingling. These exercises involve stretching your injured shoulder directly. Exercise A: Pendulum 1. Stand near a wall or a surface that you can hold onto for balance. 2. Bend at the waist and let your left / right arm hang straight down. Use your other arm to support you. Keep your back straight and do not lock your knees. 3. Relax your left / right arm and shoulder muscles, and move your hips and your trunk so your left / right arm swings freely. Your arm should swing because of the motion of your body, not because you are using your arm or shoulder muscles. 4. Keep moving your body so your arm swings in the following directions, as told by your health care provider: ? Side to side. ? Forward and backward. ? In clockwise and  counterclockwise circles. 5. Continue each motion for __________ seconds, or for as long as told by your health care provider. 6. Slowly return to the starting position. Repeat __________ times. Complete this exercise __________ times a day. Exercise B:Flexion, Standing 1. Stand and hold a broomstick, a cane, or a similar object. Place your hands a little more than shoulder-width apart on the object. Your left / right hand should be palm-up, and your other hand should be palm-down. 2. Keep your elbow straight and keep your shoulder muscles relaxed. Push the stick down with your healthy arm to raise your left / right arm in front of your body, and then over your head until you feel a stretch in your shoulder. ? Avoid shrugging your shoulder while you raise your arm. Keep your shoulder blade tucked down toward the middle of your back. 3. Hold for __________ seconds. 4. Slowly return to the starting position. Repeat  __________ times. Complete this exercise __________ times a day. Exercise C: Abduction, Standing 1. Stand and hold a broomstick, a cane, or a similar object. Place your hands a little more than shoulder-width apart on the object. Your left / right hand should be palm-up, and your other hand should be palm-down. 2. While keeping your elbow straight and your shoulder muscles relaxed, push the stick across your body toward your left / right side. Raise your left / right arm to the side of your body and then over your head until you feel a stretch in your shoulder. ? Do not raise your arm above shoulder height, unless your health care provider tells you to do that. ? Avoid shrugging your shoulder while you raise your arm. Keep your shoulder blade tucked down toward the middle of your back. 3. Hold for __________ seconds. 4. Slowly return to the starting position. Repeat __________ times. Complete this exercise __________ times a day. Exercise D:Internal Rotation 1. Place your left / right hand behind your back, palm-up. 2. Use your other hand to dangle an exercise band, a towel, or a similar object over your shoulder. Grasp the band with your left / right hand so you are holding onto both ends. 3. Gently pull up on the band until you feel a stretch in the front of your left / right shoulder. ? Avoid shrugging your shoulder while you raise your arm. Keep your shoulder blade tucked down toward the middle of your back. 4. Hold for __________ seconds. 5. Release the stretch by letting go of the band and lowering your hands. Repeat __________ times. Complete this exercise __________ times a day. Stretching Exercises  These exercises warm up your muscles and joints and improve the movement and flexibility of your shoulder. These exercises also help to relieve pain, numbness, and tingling. These exercises are done using your healthy shoulder to help stretch the muscles of your injured shoulder. Exercise E:  Warehouse manager (External Rotation and Abduction) 1. Stand in a doorway with one of your feet slightly in front of the other. This is called a staggered stance. If you cannot reach your forearms to the door frame, stand facing a corner of a room. 2. Choose one of the following positions as told by your health care provider: ? Place your hands and forearms on the door frame above your head. ? Place your hands and forearms on the door frame at the height of your head. ? Place your hands on the door frame at the height of your elbows. 3. Slowly move your weight  onto your front foot until you feel a stretch across your chest and in the front of your shoulders. Keep your head and chest upright and keep your abdominal muscles tight. 4. Hold for __________ seconds. 5. To release the stretch, shift your weight to your back foot. Repeat __________ times. Complete this stretch __________ times a day. Exercise F:Extension, Standing 1. Stand and hold a broomstick, a cane, or a similar object behind your back. ? Your hands should be a little wider than shoulder-width apart. ? Your palms should face away from your back. 2. Keeping your elbows straight and keeping your shoulder muscles relaxed, move the stick away from your body until you feel a stretch in your shoulder. ? Avoid shrugging your shoulders while you move the stick. Keep your shoulder blade tucked down toward the middle of your back. 3. Hold for __________ seconds. 4. Slowly return to the starting position. Repeat __________ times. Complete this exercise __________ times a day. Strengthening Exercises           These exercises build strength and endurance in your shoulder. Endurance is the ability to use your muscles for a long time, even after they get tired. Exercise G:External Rotation 1. Sit in a stable chair without armrests. 2. Secure an exercise band at elbow height on your left / right side. 3. Place a soft object, such as a  folded towel or a small pillow, between your left / right upper arm and your body to move your elbow a few inches away (about 10 cm) from your side. 4. Hold the end of the band so it is tight and there is no slack. 5. Keeping your elbow pressed against the soft object, move your left / right forearm out, away from your abdomen. Keep your body steady so only your forearm moves. 6. Hold for __________ seconds. 7. Slowly return to the starting position. Repeat __________ times. Complete this exercise __________ times a day. Exercise H:Shoulder Abduction 1. Sit in a stable chair without armrests, or stand. 2. Hold a __________ weight in your left / right hand, or hold an exercise band with both hands. 3. Start with your arms straight down and your left / right palm facing in, toward your body. 4. Slowly lift your left / right hand out to your side. Do not lift your hand above shoulder height unless your health care provider tells you that this is safe. ? Keep your arms straight. ? Avoid shrugging your shoulder while you do this movement. Keep your shoulder blade tucked down toward the middle of your back. 5. Hold for __________ seconds. 6. Slowly lower your arm, and return to the starting position. Repeat __________ times. Complete this exercise __________ times a day. Exercise I:Shoulder Extension 1. Sit in a stable chair without armrests, or stand. 2. Secure an exercise band to a stable object in front of you where it is at shoulder height. 3. Hold one end of the exercise band in each hand. Your palms should face each other. 4. Straighten your elbows and lift your hands up to shoulder height. 5. Step back, away from the secured end of the exercise band, until the band is tight and there is no slack. 6. Squeeze your shoulder blades together as you pull your hands down to the sides of your thighs. Stop when your hands are straight down by your sides. Do not let your hands go behind your  body. 7. Hold for __________ seconds. 8. Slowly return to the  starting position. Repeat __________ times. Complete this exercise __________ times a day. Exercise J:Standing Shoulder Row 1. Sit in a stable chair without armrests, or stand. 2. Secure an exercise band to a stable object in front of you so it is at waist height. 3. Hold one end of the exercise band in each hand. Your palms should be in a thumbs-up position. 4. Bend each of your elbows to an "L" shape (about 90 degrees) and keep your upper arms at your sides. 5. Step back until the band is tight and there is no slack. 6. Slowly pull your elbows back behind you. 7. Hold for __________ seconds. 8. Slowly return to the starting position. Repeat __________ times. Complete this exercise __________ times a day. Exercise K:Shoulder Press-Ups 1. Sit in a stable chair that has armrests. Sit upright, with your feet flat on the floor. 2. Put your hands on the armrests so your elbows are bent and your fingers are pointing forward. Your hands should be about even with the sides of your body. 3. Push down on the armrests and use your arms to lift yourself off of the chair. Straighten your elbows and lift yourself up as much as you comfortably can. ? Move your shoulder blades down, and avoid letting your shoulders move up toward your ears. ? Keep your feet on the ground. As you get stronger, your feet should support less of your body weight as you lift yourself up. 4. Hold for __________ seconds. 5. Slowly lower yourself back into the chair. Repeat __________ times. Complete this exercise __________ times a day. Exercise L: Wall Push-Ups 1. Stand so you are facing a stable wall. Your feet should be about one arm-length away from the wall. 2. Lean forward and place your palms on the wall at shoulder height. 3. Keep your feet flat on the floor as you bend your elbows and lean forward toward the wall. 4. Hold for __________  seconds. 5. Straighten your elbows to push yourself back to the starting position. Repeat __________ times. Complete this exercise __________ times a day. This information is not intended to replace advice given to you by your health care provider. Make sure you discuss any questions you have with your health care provider. Document Released: 05/13/2005 Document Revised: 11/02/2017 Document Reviewed: 03/10/2015 Elsevier Interactive Patient Education  2019 [employer]. Back Exercises The following exercises strengthen the muscles that help to support the back. They also help to keep the lower back flexible. Doing these exercises can help to prevent back pain or lessen existing pain. If you have back pain or discomfort, try doing these exercises 2-3 times each day or as told by your health care provider. When the pain goes away, do them once each day, but increase the number of times that you repeat the steps for each exercise (do more repetitions). If you do not have back pain or discomfort, do these exercises once each day or as told by your health care provider. Exercises Single Knee to Chest Repeat these steps 3-5 times for each leg: 1. Lie on your back on a firm bed or the floor with your legs extended. 2. Bring one knee to your chest. Your other leg should stay extended and in contact with the floor. 3. Hold your knee in place by grabbing your knee or thigh. 4. Pull on your knee until you feel a gentle stretch in your lower back. 5. Hold the stretch for 10-30 seconds. 6. Slowly release and straighten your  leg. Pelvic Tilt Repeat these steps 5-10 times: 1. Lie on your back on a firm bed or the floor with your legs extended. 2. Bend your knees so they are pointing toward the ceiling and your feet are flat on the floor. 3. Tighten your lower abdominal muscles to press your lower back against the floor. This motion will tilt your pelvis so your tailbone points up toward the ceiling instead of  pointing to your feet or the floor. 4. With gentle tension and even breathing, hold this position for 5-10 seconds. Cat-Cow Repeat these steps until your lower back becomes more flexible: 1. Get into a hands-and-knees position on a firm surface. Keep your hands under your shoulders, and keep your knees under your hips. You may place padding under your knees for comfort. 2. Let your head hang down, and point your tailbone toward the floor so your lower back becomes rounded like the back of a cat. 3. Hold this position for 5 seconds. 4. Slowly lift your head and point your tailbone up toward the ceiling so your back forms a sagging arch like the back of a cow. 5. Hold this position for 5 seconds.  Press-Ups Repeat these steps 5-10 times: 1. Lie on your abdomen (face-down) on the floor. 2. Place your palms near your head, about shoulder-width apart. 3. While you keep your back as relaxed as possible and keep your hips on the floor, slowly straighten your arms to raise the top half of your body and lift your shoulders. Do not use your back muscles to raise your upper torso. You may adjust the placement of your hands to make yourself more comfortable. 4. Hold this position for 5 seconds while you keep your back relaxed. 5. Slowly return to lying flat on the floor.  Bridges Repeat these steps 10 times: 1. Lie on your back on a firm surface. 2. Bend your knees so they are pointing toward the ceiling and your feet are flat on the floor. 3. Tighten your buttocks muscles and lift your buttocks off of the floor until your waist is at almost the same height as your knees. You should feel the muscles working in your buttocks and the back of your thighs. If you do not feel these muscles, slide your feet 1-2 inches farther away from your buttocks. 4. Hold this position for 3-5 seconds. 5. Slowly lower your hips to the starting position, and allow your buttocks muscles to relax completely. If this exercise  is too easy, try doing it with your arms crossed over your chest. Abdominal Crunches Repeat these steps 5-10 times: 1. Lie on your back on a firm bed or the floor with your legs extended. 2. Bend your knees so they are pointing toward the ceiling and your feet are flat on the floor. 3. Cross your arms over your chest. 4. Tip your chin slightly toward your chest without bending your neck. 5. Tighten your abdominal muscles and slowly raise your trunk (torso) high enough to lift your shoulder blades a tiny bit off of the floor. Avoid raising your torso higher than that, because it can put too much stress on your low back and it does not help to strengthen your abdominal muscles. 6. Slowly return to your starting position. Back Lifts Repeat these steps 5-10 times: 1. Lie on your abdomen (face-down) with your arms at your sides, and rest your forehead on the floor. 2. Tighten the muscles in your legs and your buttocks. 3. Slowly lift  your chest off of the floor while you keep your hips pressed to the floor. Keep the back of your head in line with the curve in your back. Your eyes should be looking at the floor. 4. Hold this position for 3-5 seconds. 5. Slowly return to your starting position. Contact a health care provider if:  Your back pain or discomfort gets much worse when you do an exercise.  Your back pain or discomfort does not lessen within 2 hours after you exercise. If you have any of these problems, stop doing these exercises right away. Do not do them again unless your health care provider says that you can. Get help right away if:  You develop sudden, severe back pain. If this happens, stop doing the exercises right away. Do not do them again unless your health care provider says that you can. This information is not intended to replace advice given to you by your health care provider. Make sure you discuss any questions you have with your health care provider. Document Released:  08/06/2004 Document Revised: 11/02/2017 Document Reviewed: 08/23/2014 Elsevier Interactive Patient Education  [employer].

## 2018-07-27 ENCOUNTER — Other Ambulatory Visit: Payer: Self-pay | Admitting: Rheumatology

## 2018-07-29 NOTE — Progress Notes (Signed)
Patient has few WBCs and few bacteria in her UA.  If she is symptomatic she should get urine culture.

## 2018-08-01 ENCOUNTER — Encounter: Payer: Self-pay | Admitting: Rheumatology

## 2018-08-04 LAB — PROTEIN ELECTROPHORESIS
A/G Ratio: 1.2 (ref 0.7–1.7)
Albumin ELP: 4 g/dL (ref 2.9–4.4)
Alpha 1: 0.2 g/dL (ref 0.0–0.4)
Alpha 2: 0.5 g/dL (ref 0.4–1.0)
Beta: 1 g/dL (ref 0.7–1.3)
GLOBULIN, TOTAL: 3.4 g/dL (ref 2.2–3.9)
Gamma Globulin: 1.6 g/dL (ref 0.4–1.8)
Total Protein: 7.4 g/dL (ref 6.0–8.5)

## 2018-08-04 LAB — LUPUS ANTICOAGULANT PANEL
APTT: 24.1 s
Anticardiolipin Ab, IgG: <10 [GPL'U]
Anticardiolipin Ab, IgM: <10 [MPL'U]
Beta-2 Glycoprotein I, IgA: <10 SAU
Beta-2 Glycoprotein I, IgG: <10 SGU
Beta-2 Glycoprotein I, IgM: <10 SMU
DRVVT Screen Seconds: 35.5 s
Hexagonal Phospholipid Neutral: 0 s
INR: 1 ratio
PLATELET NEUTRALIZATION: 0 s
Prothrombin Time: 11 s
Thrombin Time: 18.6 s

## 2018-08-04 LAB — URINALYSIS, COMPLETE
Bilirubin, UA: NEGATIVE
Glucose, UA: NEGATIVE
Nitrite, UA: POSITIVE — AB
Protein, UA: NEGATIVE
RBC, UA: NEGATIVE
Specific Gravity, UA: 1.022 (ref 1.005–1.030)
Urobilinogen, Ur: 1 mg/dL (ref 0.2–1.0)
pH, UA: 5.5 (ref 5.0–7.5)

## 2018-08-04 LAB — MICROSCOPIC EXAMINATION: Casts: NONE SEEN /lpf

## 2018-08-04 LAB — G-6-PD, QUANT, BLOOD AND RBC
G-6-PD, Quant: 248 U/10E12 RBC (ref 146–376)
RBC: 4.24 x10E6/uL (ref 3.77–5.28)

## 2018-08-04 LAB — CK: Total CK: 159 U/L (ref 24–173)

## 2018-08-04 LAB — SEDIMENTATION RATE: Sed Rate: 15 mm/hr (ref 0–32)

## 2018-08-04 NOTE — Progress Notes (Signed)
I will discuss lab results at the follow-up visit.

## 2018-08-05 ENCOUNTER — Telehealth: Payer: Self-pay | Admitting: Rheumatology

## 2018-08-05 NOTE — Telephone Encounter (Signed)
Patient called stating she was returning a call to the office.   

## 2018-08-05 NOTE — Progress Notes (Signed)
Patient has nitrite and few bacteria in her UA.  If she is symptomatic please advise her to go see her PCP for urine culture.

## 2018-08-08 ENCOUNTER — Encounter (INDEPENDENT_AMBULATORY_CARE_PROVIDER_SITE_OTHER): Payer: Self-pay | Admitting: Internal Medicine

## 2018-08-08 ENCOUNTER — Ambulatory Visit (INDEPENDENT_AMBULATORY_CARE_PROVIDER_SITE_OTHER): Payer: Self-pay | Admitting: Internal Medicine

## 2018-08-08 ENCOUNTER — Other Ambulatory Visit: Payer: Self-pay

## 2018-08-08 VITALS — BP 145/85 | HR 64 | Temp 97.7°F | Ht 74.0 in | Wt 239.6 lb

## 2018-08-08 DIAGNOSIS — R350 Frequency of micturition: Secondary | ICD-10-CM

## 2018-08-08 LAB — POCT URINALYSIS DIPSTICK
Bilirubin, UA: NEGATIVE
Glucose, UA: NEGATIVE
Ketones, UA: NEGATIVE
Leukocytes, UA: NEGATIVE
Nitrite, UA: NEGATIVE
Protein, UA: NEGATIVE
Spec Grav, UA: 1.015 (ref 1.010–1.025)
Urobilinogen, UA: 0.2 E.U./dL
pH, UA: 7 (ref 5.0–8.0)

## 2018-08-08 MED ORDER — MIRABEGRON ER 50 MG PO TB24: 50.0000 mg | ORAL_TABLET | Freq: Every day | ORAL | 3 refills | Status: DC

## 2018-08-08 MED FILL — MYRBETRIQ ER 50 MG TABLET: 50 | 30 days supply | Qty: 30 | Fill #0

## 2018-08-08 NOTE — Progress Notes (Signed)
Patient ID: Haley [facility], female    DOB: Mar 04, 1971  MRN: [id number]  CC: bacteria in urine    Subjective: Haley [facility] is a 48 y.o. female who presents for UC  Her concerns today include:  Patient with history of GERD, GAD, HSV-2  Patient was seen by the rheumatologist recently and had a urinalysis.  It was positive for nitrates and leukocytes.  Patient states that she was called and told to see her PCP.  This urine was done on the 15th of this month.  Pt denies dysuria, hematuria or fever. She is on Mybetriq for what sounds like overactive bladder.  However she has been out of it for 3 weeks.  States that it has been ordered through the patient assistance program at our pharmacy but she has not received a call to confirm whether it has come in as yet. Patient Active Problem List   Diagnosis Date Noted  . 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 Symptomatic cholelithiasis 12/05/2016  . Acute cholecystitis 12/04/2016     Current Outpatient Medications on File Prior to Visit  Medication Sig Dispense Refill  . acetaminophen (TYLENOL) 500 MG tablet Take 500 mg by mouth as needed.    . DULoxetine (CYMBALTA) 30 MG capsule Take 1 capsule (30 mg total) by mouth daily. 30 capsule 5  . MELATONIN PO Take by mouth as needed.    . rifampin (RIFADIN) 300 MG capsule Take 2 capsules (600 mg total) by mouth daily. 60 capsule 3  . valACYclovir (VALTREX) 1000 MG tablet Take 1 tablet (1,000 mg total) by mouth daily. 30 tablet 99   Current Facility-Administered Medications on File Prior to Visit  Medication Dose Route Frequency Provider Last Rate Last Dose  . 0.9 %  sodium chloride infusion  500 mL Intravenous Continuous Irene Shipper, MD        No Known Allergies  Social History   Socioeconomic History  . Marital status: Single    Spouse name: Not on file  . Number of children: Not on file  . Years of education: Not on file  . Highest education level: Not on file  Occupational History  . Not on file   Social Needs  . Financial resource strain: Not on file  . Food insecurity:    Worry: Not on file    Inability: Not on file  . Transportation needs:    Medical: Not on file    Non-medical: Not on file  Tobacco Use  . Smoking status: Never Smoker  . Smokeless tobacco: Never Used  Substance and Sexual Activity  . Alcohol use: Yes    Comment: occ  . Drug use: No  . Sexual activity: Not Currently  Lifestyle  . Physical activity:    Days per week: Not on file    Minutes per session: Not on file  . Stress: Not on file  Relationships  . Social connections:    Talks on phone: Not on file    Gets together: Not on file    Attends religious service: Not on file    Active member of club or organization: Not on file    Attends meetings of clubs or organizations: Not on file    Relationship status: Not on file  . Intimate partner violence:    Fear of current or ex partner: Not on file    Emotionally abused: Not on file    Physically abused: Not on file    Forced sexual activity: Not on  file  Other Topics Concern  . Not on file  Social History Narrative  . Not on file    Family History  Problem Relation Age of Onset  . Hypertension Mother   . Glaucoma Mother   . Hyperlipidemia Mother   . Diabetes Mother   . CAD Father        died of MI without prior hx.   . Diabetes Sister   . Healthy Son   . Healthy Son     Past Surgical History:  Procedure Laterality Date  . CHOLECYSTECTOMY N/A 12/05/2016   Procedure: LAPAROSCOPIC CHOLECYSTECTOMY WITH INTRAOPERATIVE CHOLANGIOGRAM;  Surgeon: Judeth Horn, MD;  Location: [facility];  Service: General;  Laterality: N/A;  . TUBAL LIGATION      ROS: Review of Systems Negative except as above. PHYSICAL EXAM: BP (!) 145/85 (BP Location: Left Arm, Patient Position: Sitting, Cuff Size: Large)   Pulse 64   Temp 97.7 F (36.5 C) (Oral)   Ht 6\' 2"  (1.88 m)   Wt 239 lb 9.6 oz (108.7 kg)   LMP 07/20/2018 (Exact Date)   SpO2 100%   BMI 30.76  kg/m   Physical Exam  General appearance - alert, well appearing, and in no distress Mental status - normal mood, behavior, speech, dress, motor activity, and thought processes   Results for orders placed or performed in visit on 08/08/18  Urinalysis Dipstick  Result Value Ref Range   Color, UA yellow    Clarity, UA clear    Glucose, UA Negative Negative   Bilirubin, UA nega    Ketones, UA negative    Spec Grav, UA 1.015 1.010 - 1.025   Blood, UA trace-intact    pH, UA 7.0 5.0 - 8.0   Protein, UA Negative Negative   Urobilinogen, UA 0.2 0.2 or 1.0 E.U./dL   Nitrite, UA negative    Leukocytes, UA Negative Negative   Appearance     Odor      ASSESSMENT AND PLAN:  1. Urinary frequency UA negative today.  Patient does not have symptoms to suggest UTI.  Will not give antibiotics at this time.  Refill given on Myrbetriq will hold order comes in through the patient assistance program. - Urinalysis Dipstick - mirabegron ER (MYRBETRIQ) 50 MG TB24 tablet; Take 1 tablet (50 mg total) by mouth daily.  Dispense: 30 tablet; Refill: 3    Patient was given the opportunity to ask questions.  Patient verbalized understanding of the plan and was able to repeat key elements of the plan.   Orders Placed This Encounter  Procedures  . Urinalysis Dipstick     Requested Prescriptions   Signed Prescriptions Disp Refills  . mirabegron ER (MYRBETRIQ) 50 MG TB24 tablet 30 tablet 3    Sig: Take 1 tablet (50 mg total) by mouth daily.    No follow-ups on file.  Karle Plumber, MD, FACP

## 2018-08-11 ENCOUNTER — Telehealth: Payer: Self-pay | Admitting: Rheumatology

## 2018-08-11 NOTE — Telephone Encounter (Signed)
Merrilee Seashore from [facility] who runs the [facility] testing left a voicemail stating he did not receive patient's insurance information with lab paperwork for her [facility] testing.  Patient filled out the APN form which would be acknowledgement that pt would be responsible for in and out of network cost.  I received a fax back stating patient filled out patient assistance portion of form, but this does not take the place of insurance information.  Please call me back on my direct line [phone]

## 2018-08-11 NOTE — Telephone Encounter (Signed)
Spoke with Almyra Free and advised patient does not have insurance and that is why patient filled out the assistance application.

## 2018-08-11 NOTE — Progress Notes (Signed)
Office Visit Note  Patient: Haley [facility]             Date of Birth: 29-Dec-1970           MRN: [id number]             PCP: Clent Demark, PA-C Referring: Clent Demark, PA-C Visit Date: 08/25/2018 Occupation: @GUAROCC @  Subjective:  Positive ANA.   History of Present Illness: Haley [facility] is a 48 y.o. female with sicca symptoms and positive Ro antibodies.  She states she has had dry eyes and dry mouth for several years.  She has been using some over-the-counter eyedrops.  She also has dry skin.  She denies any joint pain joint swelling.  There is no history of bilateral rash, rainouts phenomenon, oral ulcers.  She continues to have some discomfort in her neck and back.  She states she has joined the gym and has been exercising on regular basis.  She is also doing the exercises which she had on the handout given last visit.  Activities of Daily Living:  Patient reports morning stiffness for 0 minutes.   Patient Reports nocturnal pain.  Difficulty dressing/grooming: Denies Difficulty climbing stairs: Denies Difficulty getting out of chair: Denies Difficulty using hands for taps, buttons, cutlery, and/or writing: Denies  Review of Systems  Constitutional: Positive for fatigue. Negative for night sweats, weight gain and weight loss.  HENT: Positive for mouth dryness. Negative for mouth sores, trouble swallowing, trouble swallowing and nose dryness.   Eyes: Positive for dryness. Negative for pain, redness, itching and visual disturbance.  Respiratory: Negative for cough, shortness of breath, wheezing and difficulty breathing.   Cardiovascular: Negative for chest pain, palpitations, hypertension, irregular heartbeat and swelling in legs/feet.  Gastrointestinal: Negative for abdominal pain, blood in stool, constipation and diarrhea.  Endocrine: Positive for increased urination.  Genitourinary: Negative for painful urination, pelvic pain and vaginal dryness.  Musculoskeletal:  Positive for arthralgias and joint pain. Negative for joint swelling, myalgias, muscle weakness, morning stiffness, muscle tenderness and myalgias.  Skin: Negative for color change, rash, hair loss, redness, skin tightness, ulcers and sensitivity to sunlight.  Allergic/Immunologic: Negative for susceptible to infections.  Neurological: Positive for headaches. Negative for dizziness, light-headedness, memory loss, night sweats and weakness.  Hematological: Negative for swollen glands.  Psychiatric/Behavioral: Negative for depressed mood, confusion and sleep disturbance. The patient is not nervous/anxious.     PMFS History:  Patient Active Problem List   Diagnosis Date Noted  . 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 Symptomatic cholelithiasis 12/05/2016  . Acute cholecystitis 12/04/2016    Past Medical History:  Diagnosis Date  . GERD (gastroesophageal reflux disease)     Family History  Problem Relation Age of Onset  . Hypertension Mother   . Glaucoma Mother   . Hyperlipidemia Mother   . Diabetes Mother   . CAD Father        died of MI without prior hx.   . Diabetes Sister   . Healthy Son   . Healthy Son    Past Surgical History:  Procedure Laterality Date  . CHOLECYSTECTOMY N/A 12/05/2016   Procedure: LAPAROSCOPIC CHOLECYSTECTOMY WITH INTRAOPERATIVE CHOLANGIOGRAM;  Surgeon: Judeth Horn, MD;  Location: [facility];  Service: General;  Laterality: N/A;  . TUBAL LIGATION     Social History   Social History Narrative  . Not on file   Immunization History  Administered Date(s) Administered  . Influenza,inj,Quad PF,6+ Mos 05/12/2017,  05/06/2018  . PPD Test 05/09/2018  . Tdap 05/12/2017     Objective: Vital Signs: BP 126/86 (BP Location: Left Arm, Patient Position: Sitting, Cuff Size: Normal)   Pulse 66   Resp 15   Ht 6\' 2"  (1.88 m)   Wt 237 lb (107.5 kg)   BMI 30.43 kg/m    Physical Exam Vitals signs and nursing note reviewed.    Constitutional:      Appearance: She is well-developed.  HENT:     Head: Normocephalic and atraumatic.  Eyes:     Conjunctiva/sclera: Conjunctivae normal.  Neck:     Musculoskeletal: Normal range of motion.  Cardiovascular:     Rate and Rhythm: Normal rate and regular rhythm.     Heart sounds: Normal heart sounds.  Pulmonary:     Effort: Pulmonary effort is normal.     Breath sounds: Normal breath sounds.  Abdominal:     General: Bowel sounds are normal.     Palpations: Abdomen is soft.  Lymphadenopathy:     Cervical: No cervical adenopathy.  Skin:    General: Skin is warm and dry.     Capillary Refill: Capillary refill takes less than 2 seconds.  Neurological:     Mental Status: She is alert and oriented to person, place, and time.  Psychiatric:        Behavior: Behavior normal.      Musculoskeletal Exam: C-spine thoracic and lumbar spine good range of motion.  Shoulder joints elbow joints wrist joint MCPs PIPs DIPs been good range of motion with no synovitis.  Hip joints knee joints ankles MTPs PIPs DIPs been good range of motion with no synovitis.  CDAI Exam: CDAI Score: Not documented Patient Global Assessment: Not documented; Provider Global Assessment: Not documented Swollen: Not documented; Tender: Not documented Joint Exam   Not documented   There is currently no information documented on the homunculus. Go to the Rheumatology activity and complete the homunculus joint exam.  Investigation: No additional findings.  Imaging: No results found.  Recent Labs: Lab Results  Component Value Date   WBC 4.0 05/24/2018   HGB 12.3 05/24/2018   PLT 337 05/24/2018   NA 138 05/24/2018   K 4.5 05/24/2018   CL 105 05/24/2018   CO2 27 05/24/2018   GLUCOSE 80 05/24/2018   BUN 13 05/24/2018   CREATININE 0.93 05/24/2018   BILITOT 0.8 05/24/2018   ALKPHOS 32 (L) 01/12/2018   AST 17 05/24/2018   ALT 19 05/24/2018   PROT 7.4 07/27/2018   ALBUMIN 4.2 01/12/2018    CALCIUM 9.7 05/24/2018   GFRAA >60 04/06/2018   QFTBGOLDPLUS Positive (A) 05/12/2018  CK 159, ESR15, G6PD normal, UA showed positive nitrite Anticardiolipin negative, beta-2 negative, lupus anticoagulant canceled, SPEP pending August 01, 2018 AVISE lupus index -0.6, ANA titer negative, SSA positive (CB CAP, RNP, Smith, SSB, SCL 70, centromere, Jo 1, anticardiolipin, anti-beta-2 GP 1, RF, anti-CCP, antithyroglobulin, antithyroid peroxidase negative)  05/24/18: Hep C Ab negative, HIV-, Hep B S Ab reactive, Hep A Ab reactive, Hep B surface Ag negative, TB gold positive  01/12/18: dsDNA <1, RNP 1.8, smith <0.2, Ro 6.2, La 0.3, ANA +  Speciality Comments: No specialty comments available.  Procedures:  No procedures performed Allergies: Patient has no known allergies.   Assessment / Plan:     Visit Diagnoses: Sjogren's syndrome with keratoconjunctivitis sicca (HCC) - Positive SSA, positive ANA and sicca symptoms.  She has history of dry mouth, dry eyes and dry skin.  Still counseling guarding Sjogren's was provided.  Over-the-counter products were discussed at length.  At this point I would hold off Plaquenil.  (revised index -0.6, ANA titer negative, RNP negative) patient had no clinical features of autoimmune disease.  Arthropathy of cervical facet joint - mild.  She is doing better with the exercises.  Arthropathy of lumbar facet joint - mild.  The symptoms have improved with doing routine exercises.  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  History of gastroesophageal reflux (GERD)  History of hydronephrosis   Orders: No orders of the defined types were placed in this encounter.  No orders of the defined types were placed in this encounter.     Follow-Up Instructions: Return in about 6 months (around 02/23/2019) for +Ro.   Bo Merino, MD  Note - This record has been created using Editor, commissioning.  Chart creation errors have been sought, but may not always  have  been located. Such creation errors do not reflect on  the standard of medical care.

## 2018-08-12 DIAGNOSIS — M47812 Spondylosis without myelopathy or radiculopathy, cervical region: Secondary | ICD-10-CM | POA: Insufficient documentation

## 2018-08-15 ENCOUNTER — Telehealth: Payer: Self-pay | Admitting: Physician Assistant

## 2018-08-15 NOTE — Telephone Encounter (Signed)
[facility] patient assistance pharm calling states they have been trying to contact the pt to schedule shipping for medication myrbetriq with no success

## 2018-08-15 NOTE — Telephone Encounter (Signed)
Patient was called and informed to contact pharmacy to set up shipping information.

## 2018-08-23 MED FILL — rifAMPin 300 MG CAPS: 300 | 30 days supply | Qty: 60 | Fill #2

## 2018-08-23 MED FILL — DULoxetine HCL 30 MG CPEP: 30 | 30 days supply | Qty: 30 | Fill #5

## 2018-08-25 ENCOUNTER — Ambulatory Visit (INDEPENDENT_AMBULATORY_CARE_PROVIDER_SITE_OTHER): Payer: Self-pay | Admitting: Rheumatology

## 2018-08-25 ENCOUNTER — Encounter: Payer: Self-pay | Admitting: Rheumatology

## 2018-08-25 VITALS — BP 126/86 | HR 66 | Resp 15 | Ht 74.0 in | Wt 237.0 lb

## 2018-08-25 DIAGNOSIS — Z8719 Personal history of other diseases of the digestive system: Secondary | ICD-10-CM

## 2018-08-25 DIAGNOSIS — M47816 Spondylosis without myelopathy or radiculopathy, lumbar region: Secondary | ICD-10-CM

## 2018-08-25 DIAGNOSIS — Z87448 Personal history of other diseases of urinary system: Secondary | ICD-10-CM

## 2018-08-25 DIAGNOSIS — M47812 Spondylosis without myelopathy or radiculopathy, cervical region: Secondary | ICD-10-CM

## 2018-08-25 DIAGNOSIS — Z227 Latent tuberculosis: Secondary | ICD-10-CM

## 2018-08-25 DIAGNOSIS — M3501 Sicca syndrome with keratoconjunctivitis: Secondary | ICD-10-CM

## 2018-08-25 NOTE — Patient Instructions (Signed)
Sjogren Syndrome    Sjogren syndrome is a disease in which the body's disease-fighting system (immune system) attacks the glands that produce tears (lacrimal glands) and the glands that produce saliva (salivary glands). This makes the eyes and mouth very dry.  Sjogren syndrome is a long-term (chronic) disorder that has no cure. In some cases, it is linked to other disorders (rheumatic disorders), such as rheumatoid arthritis and systemic lupus erythematosus (SLE). It may affect other parts of the body, such as:   Kidneys.   Blood vessels.   Joints.   Lungs.   Liver.   Pancreas.   Brain.   Nerves.   Spinal cord.  What are the causes?  The cause of this condition is not known. It may be passed along from parent to child (inherited), or it may be a symptom of a rheumatic disorder.  What increases the risk?  This condition is more likely to develop in:   Women.   People who are 45-50 years old.   People who have recently had a viral infection or currently have a viral infection.  What are the signs or symptoms?  The main symptoms of this condition are:   Dry mouth. This may include:  ? A chalky feeling.  ? Difficulty swallowing, speaking, or tasting.  ? Frequent cavities in teeth.  ? Frequent mouth infections.   Dry eyes. This may include:  ? Burning, redness, and itching.  ? Blurry vision.  ? Light sensitivity.  Other symptoms may include:   Dryness of the skin and the inside of the nose.   Eyelid infections.   Vaginal dryness, if this applies.   Joint pain and stiffness.   Muscle pain and stiffness.  How is this diagnosed?  This condition is diagnosed based on:   Your symptoms.   Your medical history.   A physical exam of your eyes and mouth.  You may have tests, including:   Schirmer test. This tests your tear production.   An eye exam that is done with a magnifying device (slit-lamp exam).   An eye test that temporarily stains your eye with dye. This shows the extent of eye damage.   Tests  to check your salivary gland function.   Biopsy. This is a removal of part of a salivary gland from inside your lower lip to be studied under a microscope.   Chest X-rays.   Blood tests.   Urine tests.  How is this treated?  There is no cure for this condition, but treatment can help you manage your symptoms. Treatment options may include:   Moisture replacement therapies to help relieve dryness in your skin, mouth, and eyes.   NSAIDs to help relieve pain and stiffness.   Medicines to help relieve inflammation in your body(corticosteroids). These are usually for severe cases.   Medicines to help reduce the activity of your immune system (immunosuppressants).   Surgery or insertion of plugs to close the lacrimal glands (punctal occlusion). Thishelps keep more natural tears in your eyes.  Follow these instructions at home:   Take over-the-counter and prescription medicines only as told by your health care provider.   Take these actions to care for your eyes:  ? Use eye drops as told by your health care provider.  ? Blink at least 5-6 times a minute.  ? Protect your eyes from drafts and breezes.  ? Maintain properly humidified air. You may want to use a humidifier at home.  ? Avoid smoke.     Take these actions to care for your mouth:  ? Brush your teeth and floss after every meal.  ? Chew sugar-free gum or suck on hard candy. For some people, this can help to relieve dry mouth.  ? Use antimicrobial mouthwash daily.  ? Take frequent sips of water or sugar-free drinks.  ? Use saliva substitutes or lip balm as told by your health care provider.   Drink enough fluid to keep your urine clear or pale yellow.   Schedule and attend dentist visits every six months.   Keep all follow-up visits as told by your health care provider. This is important.  Contact a health care provider if:   You have a fever.   You have night sweats.   You are always tired.   You have unexplained weight loss.   You develop itchy  skin.   You have red patches on your skin.   You have a lump or swelling on your neck.  This information is not intended to replace advice given to you by your health care provider. Make sure you discuss any questions you have with your health care provider.  Document Released: 06/19/2002 Document Revised: 02/23/2016 Document Reviewed: 03/07/2015  [employer]vier Interactive Patient Education  2019 [employer]

## 2018-09-06 ENCOUNTER — Encounter (INDEPENDENT_AMBULATORY_CARE_PROVIDER_SITE_OTHER): Payer: Self-pay | Admitting: Primary Care

## 2018-09-06 ENCOUNTER — Ambulatory Visit (INDEPENDENT_AMBULATORY_CARE_PROVIDER_SITE_OTHER): Payer: Self-pay | Admitting: Primary Care

## 2018-09-06 ENCOUNTER — Other Ambulatory Visit: Payer: Self-pay

## 2018-09-06 VITALS — BP 120/73 | HR 70 | Temp 98.1°F | Ht 74.0 in | Wt 235.2 lb

## 2018-09-06 DIAGNOSIS — R350 Frequency of micturition: Secondary | ICD-10-CM

## 2018-09-06 DIAGNOSIS — R768 Other specified abnormal immunological findings in serum: Secondary | ICD-10-CM

## 2018-09-06 DIAGNOSIS — Z227 Latent tuberculosis: Secondary | ICD-10-CM

## 2018-09-06 DIAGNOSIS — F3289 Other specified depressive episodes: Secondary | ICD-10-CM

## 2018-09-06 DIAGNOSIS — R238 Other skin changes: Secondary | ICD-10-CM

## 2018-09-06 MED ORDER — MIRABEGRON ER 50 MG PO TB24: 50.0000 mg | ORAL_TABLET | Freq: Every day | ORAL | 3 refills | Status: DC

## 2018-09-06 MED ORDER — DULOXETINE HCL 30 MG PO CPEP: 30.0000 mg | ORAL_CAPSULE | Freq: Every day | ORAL | 5 refills | Status: DC

## 2018-09-06 NOTE — Progress Notes (Signed)
Acute Office Visit  Subjective:    Patient ID: Haley Chandler, female    DOB: [birth year], 48 y.o.   MRN: [id number]  Chief Complaint  Patient presents with  . Mass    mid way down on left side of back     HPI Patient is in today for a bump on her back for over 2 years it comes and goes. She is followed by Dr.Deveshawar for positive ANA. She has a PMH had dry eyes and dry mouth for several years, discomfort in her neck and back pain level 7/10 esp. In AM when waking up.  Past Medical History:  Diagnosis Date  . GERD (gastroesophageal reflux disease)     Past Surgical History:  Procedure Laterality Date  . CHOLECYSTECTOMY N/A 12/05/2016   Procedure: LAPAROSCOPIC CHOLECYSTECTOMY WITH INTRAOPERATIVE CHOLANGIOGRAM;  Surgeon: Judeth Horn, MD;  Location: [facility] [facility];  Service: General;  Laterality: N/A;  . TUBAL LIGATION      Family History  Problem Relation Age of Onset  . Hypertension Mother   . Glaucoma Mother   . Hyperlipidemia Mother   . Diabetes Mother   . CAD Father        died of MI without prior hx.   . Diabetes Sister   . Healthy Son   . Healthy Son     Social History   Socioeconomic History  . Marital status: Single    Spouse name: Not on file  . Number of children: Not on file  . Years of education: Not on file  . Highest education level: Not on file  Occupational History  . Not on file  Social Needs  . Financial resource strain: Not on file  . Food insecurity:    Worry: Not on file    Inability: Not on file  . Transportation needs:    Medical: Not on file    Non-medical: Not on file  Tobacco Use  . Smoking status: Never Smoker  . Smokeless tobacco: Never Used  Substance and Sexual Activity  . Alcohol use: Yes    Comment: occ  . Drug use: No  . Sexual activity: Not Currently  Lifestyle  . Physical activity:    Days per week: Not on file    Minutes per session: Not on file  . Stress: Not on file  Relationships  . Social connections:    Talks on  phone: Not on file    Gets together: Not on file    Attends religious service: Not on file    Active member of club or organization: Not on file    Attends meetings of clubs or organizations: Not on file    Relationship status: Not on file  . Intimate partner violence:    Fear of current or ex partner: Not on file    Emotionally abused: Not on file    Physically abused: Not on file    Forced sexual activity: Not on file  Other Topics Concern  . Not on file  Social History Narrative  . Not on file    Outpatient Medications Prior to Visit  Medication Sig Dispense Refill  . acetaminophen (TYLENOL) 500 MG tablet Take 500 mg by mouth as needed.    . DULoxetine (CYMBALTA) 30 MG capsule Take 1 capsule (30 mg total) by mouth daily. 30 capsule 5  . mirabegron ER (MYRBETRIQ) 50 MG TB24 tablet Take 1 tablet (50 mg total) by mouth daily. 30 tablet 3  . rifampin (RIFADIN) 300 MG capsule  Take 2 capsules (600 mg total) by mouth daily. 60 capsule 3  . valACYclovir (VALTREX) 1000 MG tablet Take 1 tablet (1,000 mg total) by mouth daily. 30 tablet 99  . MELATONIN PO Take by mouth as needed.     Facility-Administered Medications Prior to Visit  Medication Dose Route Frequency Provider Last Rate Last Dose  . 0.9 %  sodium chloride infusion  500 mL Intravenous Continuous Irene Shipper, MD        No Known Allergies  Review of Systems  Constitutional: Negative.   HENT: Negative.   Eyes: Negative.   Respiratory: Negative.   Cardiovascular: Negative.   Gastrointestinal: Negative.   Genitourinary: Negative.   Musculoskeletal: Positive for back pain.  Skin: Positive for itching.  Neurological: Positive for headaches.  Endo/Heme/Allergies: Negative.   Psychiatric/Behavioral: Negative.        Objective:    Physical Exam  Constitutional: She is oriented to person, place, and time. She appears well-developed and well-nourished.  HENT:  Head: Normocephalic.  Eyes: Pupils are equal, round, and  reactive to light. EOM are normal.  Neck: Normal range of motion.  Cardiovascular: Normal rate and regular rhythm.  Pulmonary/Chest: Effort normal and breath sounds normal.  Abdominal: Soft. Bowel sounds are normal.  Musculoskeletal: Normal range of motion.  Neurological: She is alert and oriented to person, place, and time.  Skin: Skin is warm and dry.  Psychiatric: She has a normal mood and affect.    BP 120/73 (BP Location: Left Arm, Patient Position: Sitting, Cuff Size: Large)   Pulse 70   Temp 98.1 F (36.7 C) (Oral)   Ht 6\' 2"  (1.88 m)   Wt 235 lb 3.2 oz (106.7 kg)   LMP 08/16/2018 (Approximate)   SpO2 98%   BMI 30.20 kg/m     Lab Results  Component Value Date   TSH 2.000 10/02/2016   Lab Results  Component Value Date   WBC 4.0 05/24/2018   HGB 12.3 05/24/2018   HCT 36.4 05/24/2018   MCV 92.2 05/24/2018   PLT 337 05/24/2018   Lab Results  Component Value Date   NA 138 05/24/2018   K 4.5 05/24/2018   CO2 27 05/24/2018   GLUCOSE 80 05/24/2018   BUN 13 05/24/2018   CREATININE 0.93 05/24/2018   BILITOT 0.8 05/24/2018   ALKPHOS 32 (L) 01/12/2018   AST 17 05/24/2018   ALT 19 05/24/2018   PROT 7.4 07/27/2018   ALBUMIN 4.2 01/12/2018   CALCIUM 9.7 05/24/2018   ANIONGAP 8 04/06/2018   Lab Results  Component Value Date   CHOL 157 01/12/2018   Lab Results  Component Value Date   HDL 71 01/12/2018   Lab Results  Component Value Date   LDLCALC 78 01/12/2018   Lab Results  Component Value Date   TRIG 39 01/12/2018   Lab Results  Component Value Date   CHOLHDL 2.2 01/12/2018   Lab Results  Component Value Date   HGBA1C 5.4 04/07/2017      [name] was seen today for mass.  Diagnoses and all orders for this visit:  Positive ANA (antinuclear antibody) followed by Dr.Deveshwar  Pimples ( I bump on her back she scratches scab off than complains it hurts)  Urinary frequency continue  -     mirabegron ER (MYRBETRIQ) 50 MG TB24 tablet; Take 1 tablet  (50 mg total) by mouth daily.  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   Kerin Perna, NP

## 2018-09-06 NOTE — Patient Instructions (Signed)
Ingrown Hair    An ingrown hair is a hair that curls and re-enters the skin instead of growing straight out of the skin. An ingrown hair can develop in any part of the skin that hair is removed from. An ingrown hair may cause small pockets of infection.  What are the causes?  An ingrown hair may be caused by:   Shaving.   Tweezing.   Waxing.   Using a hair removal cream.  What increases the risk?  You are more likely to develop this condition if you have curly hair.  What are the signs or symptoms?  Symptoms of an ingrown hair may include:   Small bumps on the skin. The bumps may be filled with pus.   Pain.   Itching.  How is this diagnosed?  An ingrown hair is diagnosed by a skin exam.  How is this treated?  Treatment is often not needed unless the ingrown hair has caused an infection. If needed, treatment may include:   Applying prescription creams to the skin. This can help with inflammation.   Applying warm compresses to the skin. This can help soften the skin.   Taking antibiotic medicine. An antibiotic may be prescribed if the infection is severe.   Retracting and releasing the ingrown hair tips.  Follow these instructions at home:    Medicines   Take, apply, or use over-the-counter and prescription medicines only as told by your health care provider. This includes any prescription creams.   If you were prescribed an antibiotic medicine, take it as told by your health care provider. Do not stop using the antibiotic even if you start to feel better.  General instructions   Do not shave irritated areas of skin. You may start shaving these areas again once the irritation has gone away.   To help remove ingrown hairs on your face, you may use a facial sponge in a gentle circular motion.   Do not pick or squeeze the irritated area of skin as this may cause infection and scarring.  Managing pain and swelling   If directed, apply heat to the affected area as often as told by your health care  provider. Use the heat source that your health care provider recommends, such as a moist heat pack or a heating pad.  ? Place a towel between your skin and the heat source.  ? Leave the heat on for 20-30 minutes.  ? Remove the heat if your skin turns bright red. This is especially important if you are unable to feel pain, heat, or cold. You may have a greater risk of getting burned.  How is this prevented?   Shower before shaving.   Wrap areas that you are going to shave in warm, moist wraps for several minutes before shaving. The warmth and moisture help to soften the hairs and makes ingrown hairs less likely.   Use thick shaving gels.   Use a razor that cuts hair slightly above your skin, or use an electric shaver with a long shave setting.   Shave in the direction of hair growth.   Avoid making multiple razor strokes.   Apply a moisturizing lotion after shaving.  Summary   An ingrown hair is a hair that curls and re-enters the skin instead of growing straight out of the skin.   Treatment is often not needed unless the ingrown hair has caused an infection.   Take, apply, or use over-the-counter and prescription medicines only as told   by your health care provider. This includes any prescription creams.   Do not shave irritated areas of skin. You may start shaving these areas again once the irritation has gone away.   If directed, apply heat to the affected area. Use the heat source that your health care provider recommends, such as a moist heat pack or a heating pad.  This information is not intended to replace advice given to you by your health care provider. Make sure you discuss any questions you have with your health care provider.  Document Released: 10/05/2000 Document Revised: 01/05/2018 Document Reviewed: 01/05/2018  Elsevier Interactive Patient Education  2019 [employer]

## 2018-09-26 ENCOUNTER — Other Ambulatory Visit: Payer: Self-pay | Admitting: Infectious Diseases

## 2018-09-26 DIAGNOSIS — Z227 Latent tuberculosis: Secondary | ICD-10-CM

## 2018-09-26 MED FILL — DULoxetine HCL 30 MG CPEP: 30 | 30 days supply | Qty: 30 | Fill #0

## 2018-09-28 ENCOUNTER — Ambulatory Visit (INDEPENDENT_AMBULATORY_CARE_PROVIDER_SITE_OTHER): Payer: Self-pay | Admitting: Urology

## 2018-09-28 DIAGNOSIS — R3915 Urgency of urination: Secondary | ICD-10-CM

## 2018-09-28 DIAGNOSIS — R351 Nocturia: Secondary | ICD-10-CM

## 2018-09-28 MED FILL — rifAMPin 300 MG CAPS: 300 | 30 days supply | Qty: 60 | Fill #3

## 2018-10-27 MED FILL — ?VALACYCLOVIR HCL 1 GRAM TA: 1 | 30 days supply | Qty: 30 | Fill #1

## 2018-10-27 MED FILL — ?DULoxetine HCL 30MG CPEP: 30 | 30 days supply | Qty: 30 | Fill #1

## 2018-12-07 MED FILL — ?VALACYCLOVIR 1 GRAM TAB: 1000 MG | 30 days supply | Qty: 30 | Fill #2

## 2018-12-07 MED FILL — ?DULoxetine HCL 30MG CPEP: 30 | 30 days supply | Qty: 30 | Fill #2

## 2019-01-10 MED FILL — valACYclovir HCL 1 GM TABS: 1 | 30 days supply | Qty: 30 | Fill #3

## 2019-01-23 ENCOUNTER — Other Ambulatory Visit: Payer: Self-pay

## 2019-01-23 ENCOUNTER — Ambulatory Visit: Payer: Self-pay | Attending: Primary Care

## 2019-01-25 ENCOUNTER — Ambulatory Visit (INDEPENDENT_AMBULATORY_CARE_PROVIDER_SITE_OTHER): Payer: Self-pay | Admitting: Urology

## 2019-01-25 DIAGNOSIS — R3915 Urgency of urination: Secondary | ICD-10-CM

## 2019-01-25 DIAGNOSIS — R351 Nocturia: Secondary | ICD-10-CM

## 2019-02-08 MED FILL — valACYclovir HCL 1 GM TABS: 1 | 30 days supply | Qty: 30 | Fill #4

## 2019-02-09 NOTE — Progress Notes (Signed)
Office Visit Note  Patient: Haley Chandler             Date of Birth: 1970-12-08           MRN: [id number]             PCP: Kerin Perna, NP Referring: Clent Demark, PA-C Visit Date: 02/23/2019 Occupation: @GUAROCC @  Subjective:  Sicca symptoms   History of Present Illness: Haley Chandler is a 48 y.o. female with history of Sjogren's syndrome.She continues to have chronic dry mouth and dry eyes.  She uses eye drops daily for symptomatic relief and biotene products for mouth dryness.  She has an upcoming appointment with the dentist. She has occasional oral ulcerations but no nasal ulcerations.  She denies any recent rashes.   She denies any photosensitivity.  She continues to have chronic fatigue but no fevers recently.  She has not had any symptoms of Raynaud's.  She is having severe acid reflux, and she started on Nexium several days ago. She is planning on following up with her PCP. She continues to have chronic pain in both feet but denies any joint swelling.  She has chronic lower back pain and stiffness.     Activities of Daily Living:  Patient reports morning stiffness for 0 minutes.   Patient Denies nocturnal pain.  Difficulty dressing/grooming: Denies Difficulty climbing stairs: Denies Difficulty getting out of chair: Denies Difficulty using hands for taps, buttons, cutlery, and/or writing: Denies  Review of Systems  Constitutional: Positive for fatigue.  HENT: Positive for mouth sores and mouth dryness. Negative for nose dryness.   Eyes: Positive for dryness. Negative for pain and visual disturbance.  Respiratory: Negative for cough, hemoptysis, shortness of breath and difficulty breathing.   Cardiovascular: Positive for palpitations. Negative for chest pain, hypertension and swelling in legs/feet.  Gastrointestinal: Positive for heartburn. Negative for blood in stool, constipation and diarrhea.  Endocrine: Negative for increased urination.  Genitourinary: Negative  for painful urination.  Musculoskeletal: Positive for arthralgias and joint pain. Negative for joint swelling, myalgias, muscle weakness, morning stiffness, muscle tenderness and myalgias.  Skin: Negative for color change, pallor, rash, hair loss, nodules/bumps, skin tightness, ulcers and sensitivity to sunlight.  Allergic/Immunologic: Negative for susceptible to infections.  Neurological: Negative for dizziness, numbness, headaches and weakness.  Hematological: Negative for swollen glands.  Psychiatric/Behavioral: Negative for depressed mood and sleep disturbance. The patient is not nervous/anxious.     PMFS History:  Patient Active Problem List   Diagnosis Date Noted  . 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 Symptomatic cholelithiasis 12/05/2016  . Acute cholecystitis 12/04/2016    Past Medical History:  Diagnosis Date  . GERD (gastroesophageal reflux disease)     Family History  Problem Relation Age of Onset  . Hypertension Mother   . Glaucoma Mother   . Hyperlipidemia Mother   . Diabetes Mother   . CAD Father        died of MI without prior hx.   . Diabetes Sister   . Healthy Son   . Healthy Son    Past Surgical History:  Procedure Laterality Date  . CHOLECYSTECTOMY N/A 12/05/2016   Procedure: LAPAROSCOPIC CHOLECYSTECTOMY WITH INTRAOPERATIVE CHOLANGIOGRAM;  Surgeon: Judeth Horn, MD;  Location: [facility];  Service: General;  Laterality: N/A;  . TUBAL LIGATION     Social History   Social History Narrative  . Not on file   Immunization History  Administered Date(s) Administered  .  Influenza,inj,Quad PF,6+ Mos 05/12/2017, 05/06/2018  . PPD Test 05/09/2018  . Tdap 05/12/2017     Objective: Vital Signs: BP 120/73 (BP Location: Left Arm, Patient Position: Sitting, Cuff Size: Large)   Pulse 70   Resp 14   Ht 6\' 2"  (1.88 m)   Wt 243 lb (110.2 kg)   BMI 31.20 kg/m    Physical Exam Vitals signs and nursing note reviewed.   Constitutional:      Appearance: She is well-developed.  HENT:     Head: Normocephalic and atraumatic.  Eyes:     Conjunctiva/sclera: Conjunctivae normal.  Neck:     Musculoskeletal: Normal range of motion.  Cardiovascular:     Rate and Rhythm: Normal rate and regular rhythm.     Heart sounds: Normal heart sounds.  Pulmonary:     Effort: Pulmonary effort is normal.     Breath sounds: Normal breath sounds.  Abdominal:     General: Bowel sounds are normal.     Palpations: Abdomen is soft.  Lymphadenopathy:     Cervical: No cervical adenopathy.  Skin:    General: Skin is warm and dry.     Capillary Refill: Capillary refill takes less than 2 seconds.  Neurological:     Mental Status: She is alert and oriented to person, place, and time.  Psychiatric:        Behavior: Behavior normal.      Musculoskeletal Exam: C-spine is good range of motion.  She has painful range of motion of the lumbar spine.  No midline spinal tenderness.  Shoulder joints, elbow joints, wrist joints, MCPs, PIPs, DIPs good range of motion no synovitis.  She has mild DIP synovial thickening.  Hip joints, knee joints, ankle joints, MCPs, PIPs, DIPs good range of motion with no synovitis.  No warmth or effusion of bilateral knee joints.  No tenderness or swelling of ankle joints.  No tenderness of MTPs.  CDAI Exam: CDAI Score: - Patient Global: -; Provider Global: - Swollen: -; Tender: - Joint Exam   No joint exam has been documented for this visit   There is currently no information documented on the homunculus. Go to the Rheumatology activity and complete the homunculus joint exam.  Investigation: No additional findings.  Imaging: No results found.  Recent Labs: Lab Results  Component Value Date   WBC 4.0 05/24/2018   HGB 12.3 05/24/2018   PLT 337 05/24/2018   NA 138 05/24/2018   K 4.5 05/24/2018   CL 105 05/24/2018   CO2 27 05/24/2018   GLUCOSE 80 05/24/2018   BUN 13 05/24/2018   CREATININE  0.93 05/24/2018   BILITOT 0.8 05/24/2018   ALKPHOS 32 (L) 01/12/2018   AST 17 05/24/2018   ALT 19 05/24/2018   PROT 7.4 07/27/2018   ALBUMIN 4.2 01/12/2018   CALCIUM 9.7 05/24/2018   GFRAA >60 04/06/2018   QFTBGOLDPLUS Positive (A) 05/12/2018    Speciality Comments: No specialty comments available.  Procedures:  No procedures performed Allergies: Patient has no known allergies.     Assessment / Plan:     Visit Diagnoses: Sjogren's syndrome with keratoconjunctivitis sicca (HCC) - Positive SSA, RF-, positive ANA and sicca symptoms, AVISE index -0.6, ANA titer negative, RNP negative: She continues to have chronic sicca symptoms.  She has been using over-the-counter eyedrops and Biotene products for symptomatic relief.  She declined a prescription for pilocarpine.  She has an upcoming dental appointment.  We discussed the importance of having good oral hygiene.  She has no other clinical features of autoimmune disease at this time. We discussed the importance of having a EKG to assess for arrhythmias since she is Ro+.  She was given a prescription to get a baseline EKG. She was advised to notify us if she develops any new or worsening symptoms.  She will follow up on a yearly basis.   Arthropathy of cervical facet joint - She has no neck pain or stiffness at this time.  She has good ROM with no discomfort. She has no symptoms of radiculopathy at this time.   Arthropathy of lumbar facet joint -Chronic pain.  She has slightly limited range of motion with discomfort.  She has no symptoms of sciatica or radiculopathy at this time.  She performs back exercises on a regular basis.  History of gastroesophageal reflux (GERD)-She has been experiencing severe symptoms of acid reflux.  She recently started herself on over-the-counter Nexium.  She is planning on following up with her PCP to discuss more effective treatment options and recommendations.  Other medical conditions are listed as follows:    TB lung, latent - treated with rifampin    History of hydronephrosis    Orders: No orders of the defined types were placed in this encounter.  No orders of the defined types were placed in this encounter.    Follow-Up Instructions: Return in about 1 year (around 02/23/2020) for Sjogren's syndrome.   Hazel Sams PA-C   I examined and evaluated the patient with Hazel Sams PA.  Patient continues to have some sicca symptoms.  Over-the-counter products were discussed.  I also discussed the option of using pilocarpine.  After discussing the side effects she declined the medication.  The plan of care was discussed as noted above.  Bo Merino, MD  Note - This record has been created using Editor, commissioning.  Chart creation errors have been sought, but may not always  have been located. Such creation errors do not reflect on  the standard of medical care.

## 2019-02-23 ENCOUNTER — Other Ambulatory Visit: Payer: Self-pay

## 2019-02-23 ENCOUNTER — Ambulatory Visit (INDEPENDENT_AMBULATORY_CARE_PROVIDER_SITE_OTHER): Payer: Self-pay | Admitting: Rheumatology

## 2019-02-23 ENCOUNTER — Encounter: Payer: Self-pay | Admitting: Rheumatology

## 2019-02-23 VITALS — BP 120/73 | HR 70 | Resp 14 | Ht 74.0 in | Wt 243.0 lb

## 2019-02-23 DIAGNOSIS — Z227 Latent tuberculosis: Secondary | ICD-10-CM

## 2019-02-23 DIAGNOSIS — M47816 Spondylosis without myelopathy or radiculopathy, lumbar region: Secondary | ICD-10-CM

## 2019-02-23 DIAGNOSIS — M47812 Spondylosis without myelopathy or radiculopathy, cervical region: Secondary | ICD-10-CM

## 2019-02-23 DIAGNOSIS — Z87448 Personal history of other diseases of urinary system: Secondary | ICD-10-CM

## 2019-02-23 DIAGNOSIS — Z8719 Personal history of other diseases of the digestive system: Secondary | ICD-10-CM

## 2019-02-23 DIAGNOSIS — M3501 Sicca syndrome with keratoconjunctivitis: Secondary | ICD-10-CM

## 2019-03-07 ENCOUNTER — Encounter (INDEPENDENT_AMBULATORY_CARE_PROVIDER_SITE_OTHER): Payer: Self-pay | Admitting: Primary Care

## 2019-03-07 ENCOUNTER — Other Ambulatory Visit: Payer: Self-pay

## 2019-03-07 ENCOUNTER — Ambulatory Visit (INDEPENDENT_AMBULATORY_CARE_PROVIDER_SITE_OTHER): Payer: Self-pay | Admitting: Primary Care

## 2019-03-07 VITALS — BP 129/75 | HR 64 | Temp 97.5°F | Ht 74.0 in | Wt 243.0 lb

## 2019-03-07 DIAGNOSIS — K219 Gastro-esophageal reflux disease without esophagitis: Secondary | ICD-10-CM

## 2019-03-07 DIAGNOSIS — R058 Other specified cough: Secondary | ICD-10-CM

## 2019-03-07 DIAGNOSIS — M069 Rheumatoid arthritis, unspecified: Secondary | ICD-10-CM

## 2019-03-07 DIAGNOSIS — R05 Cough: Secondary | ICD-10-CM

## 2019-03-07 MED ORDER — DEXLANSOPRAZOLE 30 MG PO CPDR: 30.0000 mg | DELAYED_RELEASE_CAPSULE | Freq: Every day | ORAL | 1 refills | Status: DC

## 2019-03-07 MED ORDER — DM-GUAIFENESIN ER 30-600 MG PO TB12: 1.0000 | ORAL_TABLET | Freq: Two times a day (BID) | ORAL | 2 refills | Status: DC | PRN

## 2019-03-07 MED FILL — DEXILANT DR 30 MG CAPSULE: 30 | 30 days supply | Qty: 30 | Fill #0

## 2019-03-07 NOTE — Progress Notes (Signed)
Established Patient Office Visit  Subjective:  Patient ID: Haley Chandler, female    DOB: January 19, 1971  Age: 48 y.o. MRN: [id number]  CC:  Chief Complaint  Patient presents with  . Gastroesophageal Reflux  . Rheumatoid Arthritis    needs EKG per specialist     HPI Haley Chandler presents for an EKG ordered by her rheumatologist and she voiced complaints about thick mucous irritating her throat with and without cough. She also stated that Nexium no longer effective she wanted to have the previous acid reflux prescribed.   Past Medical History:  Diagnosis Date  . GERD (gastroesophageal reflux disease)     Past Surgical History:  Procedure Laterality Date  . CHOLECYSTECTOMY N/A 12/05/2016   Procedure: LAPAROSCOPIC CHOLECYSTECTOMY WITH INTRAOPERATIVE CHOLANGIOGRAM;  Surgeon: Judeth Horn, MD;  Location: [place];  Service: General;  Laterality: N/A;  . TUBAL LIGATION      Family History  Problem Relation Age of Onset  . Hypertension Mother   . Glaucoma Mother   . Hyperlipidemia Mother   . Diabetes Mother   . CAD Father        died of MI without prior hx.   . Diabetes Sister   . Healthy Son   . Healthy Son     Social History   Socioeconomic History  . Marital status: Single    Spouse name: Not on file  . Number of children: Not on file  . Years of education: Not on file  . Highest education level: Not on file  Occupational History  . Not on file  Social Needs  . Financial resource strain: Not on file  . Food insecurity    Worry: Not on file    Inability: Not on file  . Transportation needs    Medical: Not on file    Non-medical: Not on file  Tobacco Use  . Smoking status: Never Smoker  . Smokeless tobacco: Never Used  Substance and Sexual Activity  . Alcohol use: Yes    Comment: occ  . Drug use: No  . Sexual activity: Not Currently  Lifestyle  . Physical activity    Days per week: Not on file    Minutes per session: Not on file  . Stress: Not on file   Relationships  . Social Herbalist on phone: Not on file    Gets together: Not on file    Attends religious service: Not on file    Active member of club or organization: Not on file    Attends meetings of clubs or organizations: Not on file    Relationship status: Not on file  . Intimate partner violence    Fear of current or ex partner: Not on file    Emotionally abused: Not on file    Physically abused: Not on file    Forced sexual activity: Not on file  Other Topics Concern  . Not on file  Social History Narrative  . Not on file    Outpatient Medications Prior to Visit  Medication Sig Dispense Refill  . acetaminophen (TYLENOL) 500 MG tablet Take 500 mg by mouth as needed.    . Multiple Vitamin (MULTIVITAMIN PO) Take by mouth daily.    . valACYclovir (VALTREX) 1000 MG tablet Take 1 tablet (1,000 mg total) by mouth daily. 30 tablet 99   Facility-Administered Medications Prior to Visit  Medication Dose Route Frequency Provider Last Rate Last Dose  . 0.9 %  sodium chloride infusion  500 mL Intravenous Continuous Irene Shipper, MD        No Known Allergies  ROS Review of Systems  HENT: Positive for sore throat.   Respiratory: Positive for cough.   Gastrointestinal: Positive for abdominal distention.      Objective:    Physical Exam  Constitutional: She is oriented to person, place, and time. She appears well-developed and well-nourished.  HENT:  Head: Normocephalic.  Neck: Normal range of motion. Neck supple.  Cardiovascular: Normal rate.  Pulmonary/Chest: Effort normal and breath sounds normal.  Abdominal: Soft. Bowel sounds are normal. She exhibits distension.  Musculoskeletal: Normal range of motion.  Neurological: She is alert and oriented to person, place, and time.  Skin: Skin is warm and dry.  Psychiatric: She has a normal mood and affect.    BP 129/75 (BP Location: Right Arm, Patient Position: Sitting, Cuff Size: Large)   Pulse 64   Temp  (!) 97.5 F (36.4 C) (Tympanic)   Ht 6\' 2"  (1.88 m)   Wt 243 lb (110.2 kg)   LMP 02/28/2019 (Approximate)   SpO2 98%   BMI 31.20 kg/m  Wt Readings from Last 3 Encounters:  03/07/19 243 lb (110.2 kg)  02/23/19 243 lb (110.2 kg)  09/06/18 235 lb 3.2 oz (106.7 kg)     Health Maintenance Due  Topic Date Due  . INFLUENZA VACCINE  02/11/2019    There are no preventive care reminders to display for this patient.  Lab Results  Component Value Date   TSH 2.000 10/02/2016   Lab Results  Component Value Date   WBC 4.0 05/24/2018   HGB 12.3 05/24/2018   HCT 36.4 05/24/2018   MCV 92.2 05/24/2018   PLT 337 05/24/2018   Lab Results  Component Value Date   NA 138 05/24/2018   K 4.5 05/24/2018   CO2 27 05/24/2018   GLUCOSE 80 05/24/2018   BUN 13 05/24/2018   CREATININE 0.93 05/24/2018   BILITOT 0.8 05/24/2018   ALKPHOS 32 (L) 01/12/2018   AST 17 05/24/2018   ALT 19 05/24/2018   PROT 7.4 07/27/2018   ALBUMIN 4.2 01/12/2018   CALCIUM 9.7 05/24/2018   ANIONGAP 8 04/06/2018   Lab Results  Component Value Date   CHOL 157 01/12/2018   Lab Results  Component Value Date   HDL 71 01/12/2018   Lab Results  Component Value Date   LDLCALC 78 01/12/2018   Lab Results  Component Value Date   TRIG 39 01/12/2018   Lab Results  Component Value Date   CHOLHDL 2.2 01/12/2018   Lab Results  Component Value Date   HGBA1C 5.4 04/07/2017      Assessment & Plan:   Keryn was seen today for gastroesophageal reflux and rheumatoid arthritis.  Diagnoses and all orders for this visit:  Rheumatoid arthritis, involving unspecified site, unspecified rheumatoid factor presence ([place]) -     Cancel: EKG 12-Lead -     PR RHYTHM ECG WITH REPORT -     EKG 12-Lead  Gastroesophageal reflux disease without esophagitis Discussed eating small frequent meal, reduction in acidic foods, fried foods ,spicy foods, alcohol caffeine and tobacco and certain medications. Avoid laying down after  eating 6mins-1hour, elevated head of the bed.  Recurrent productive cough The most common causes of chronic cough in immunocompetent adults include the following: upper airway cough syndrome (UACS), previously referred to as postnasal drip syndrome (PNDS), which is caused by variety of rhinosinus conditions; (2) asthma; (3) GERD; (4) chronic  bronchitis from cigarette smoking or other inhaled environmental irritants; (5) nonasthmatic eosinophilic bronchitis; and (6) bronchiectasis.   Other orders -     dextromethorphan-guaiFENesin (MUCINEX DM) 30-600 MG 12hr tablet; Take 1 tablet by mouth 2 (two) times daily as needed for cough. -     Dexlansoprazole 30 MG capsule; Take 1 capsule (30 mg total) by mouth daily.    Meds ordered this encounter  Medications  . dextromethorphan-guaiFENesin (MUCINEX DM) 30-600 MG 12hr tablet    Sig: Take 1 tablet by mouth 2 (two) times daily as needed for cough.    Dispense:  30 tablet    Refill:  2  . Dexlansoprazole 30 MG capsule    Sig: Take 1 capsule (30 mg total) by mouth daily.    Dispense:  30 capsule    Refill:  1    Follow-up: Return in about 3 months (around 06/07/2019), or if symptoms worsen or fail to improve.    Kerin Perna, NP

## 2019-03-08 NOTE — Progress Notes (Signed)
EKG normal sinus rhythm.

## 2019-03-08 NOTE — Addendum Note (Signed)
Addended by: Nat Christen on: 03/08/2019 12:02 PM   Modules accepted: Orders

## 2019-03-09 ENCOUNTER — Ambulatory Visit (INDEPENDENT_AMBULATORY_CARE_PROVIDER_SITE_OTHER): Payer: Self-pay | Admitting: Primary Care

## 2019-03-13 MED FILL — valACYclovir HCL 1 GM TABS: 1 | 30 days supply | Qty: 30 | Fill #5

## 2019-03-21 ENCOUNTER — Ambulatory Visit (INDEPENDENT_AMBULATORY_CARE_PROVIDER_SITE_OTHER): Payer: Self-pay

## 2019-03-21 ENCOUNTER — Other Ambulatory Visit: Payer: Self-pay

## 2019-03-21 DIAGNOSIS — Z23 Encounter for immunization: Secondary | ICD-10-CM

## 2019-03-22 ENCOUNTER — Encounter

## 2019-03-29 ENCOUNTER — Encounter (INDEPENDENT_AMBULATORY_CARE_PROVIDER_SITE_OTHER): Payer: Self-pay | Admitting: Primary Care

## 2019-03-29 ENCOUNTER — Other Ambulatory Visit: Payer: Self-pay

## 2019-03-29 ENCOUNTER — Ambulatory Visit (INDEPENDENT_AMBULATORY_CARE_PROVIDER_SITE_OTHER): Payer: Self-pay | Admitting: Primary Care

## 2019-03-29 DIAGNOSIS — R05 Cough: Secondary | ICD-10-CM

## 2019-03-29 DIAGNOSIS — R6883 Chills (without fever): Secondary | ICD-10-CM

## 2019-03-29 DIAGNOSIS — J301 Allergic rhinitis due to pollen: Secondary | ICD-10-CM

## 2019-03-29 DIAGNOSIS — R058 Other specified cough: Secondary | ICD-10-CM

## 2019-03-29 DIAGNOSIS — L240 Irritant contact dermatitis due to detergents: Secondary | ICD-10-CM

## 2019-03-29 DIAGNOSIS — B009 Herpesviral infection, unspecified: Secondary | ICD-10-CM

## 2019-03-29 MED ORDER — DM-GUAIFENESIN ER 30-600 MG PO TB12: 1.0000 | ORAL_TABLET | Freq: Two times a day (BID) | ORAL | 2 refills | Status: DC | PRN

## 2019-03-29 MED ORDER — LORATADINE 10 MG PO TABS: 10.0000 mg | ORAL_TABLET | Freq: Every day | ORAL | 11 refills | Status: DC

## 2019-03-29 NOTE — Progress Notes (Signed)
Pt complains of excessive mucus States she is not always about to expel mucus Mucus is brown in color  Pt would like antibiotic or chest xray States its like mucus is stuck in her throat OTC medication suggested by PCP did not help.  Issue has been ongoing for months

## 2019-03-29 NOTE — Progress Notes (Signed)
Virtual Visit via Telephone Note  I connected with Haley Chandler on 03/29/19 at  9:10 AM EDT by telephone and verified that I am speaking with the correct person using two identifiers.   I discussed the limitations, risks, security and privacy concerns of performing an evaluation and management service by telephone and the availability of in person appointments. I also discussed with the patient that there may be a patient responsible charge related to this service. The patient expressed understanding and agreed to proceed.   History of Present Illness: Haley Chandler presents forcomplaints about thick mucous irritating her throat with and without cough. She denies fever, but haves chills all the time and her body and rash.  She has change her soaps for her body.  Past Medical History:  Diagnosis Date  . GERD (gastroesophageal reflux disease)     Observations/Objective: Review of Systems  Constitutional: Positive for chills.  HENT: Positive for congestion.   Respiratory: Positive for cough.   Skin: Positive for itching and rash.       Changed soaps  All other systems reviewed and are negative.   Assessment and Plan: Yosan was seen today for mucus.  Diagnoses and all orders for this visit:  Recurrent productive cough The most common causes of chronic cough in immunocompetent adults include the following: upper airway cough syndrome (UACS), previously referred to as postnasal drip syndrome (PNDS), which is caused by variety of rhinosinus conditions; (2) asthma; (3) GERD; (4) chronic bronchitis from cigarette smoking or other inhaled environmental irritants; (5) nonasthmatic eosinophilic bronchitis; and (6) bronchiectasis.   These conditions, singly or in combination, have accounted for up to 94% of the causes of chronic cough in prospective studies.   Other conditions have constituted no >6% of the causes in prospective studies These have included bronchogenic carcinoma, chronic  interstitial pneumonia, sarcoidosis, left ventricular failure, ACEI-induced cough, and aspiration from a condition associated with pharyngeal dysfunction.    Chronic cough is often simultaneously caused by more than one condition. A single cause has been found from 38 to 82% of the time, multiple causes from 18 to 62%. Multiply caused cough has been the result of three diseases up to 42% of the time.  -     dextromethorphan-guaiFENesin (MUCINEX DM) 30-600 MG 12hr tablet; Take 1 tablet by mouth 2 (two) times daily as needed for cough.   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 Treatment prophylactically with Valtrex at thousand milligrams daily.  Irritant contact dermatitis due to detergent Contact dermatitis is hypersensitivity reaction to a substance causing cellular immunity response. This may cause papules, vesicles, bullae with surrounding erythema and pruritic .Conservative treatment luke warm baths, Aveeno oatmeal bath or emollients. Medications can be discussed at this visit- antihistamine or pruritic medications. Causes may be contributed to changes in soaps, deodorants, laundry detergent or environmental- grass, weeds and trees.  Non-seasonal allergic rhinitis due to pollen  Use Flonase nasal spray for at least duration of your allergy season.  - For appropriate administration of the nasal spray, clear the nose, use opposite hand for opposite nare, sniff gently, exhale through your mouth. - For maximal effect take these two nasal sprays at least 30 minutes apart. - Continue Allegra, Claritin or Zyrtec each day, as needed. - Drink at least 64 ounces of water each day. - If you have a humidifier use it nightly. - Remove as many irritants/allergies as you are able to, no pets in the bedroom, change  air filters in air vents. -     loratadine (CLARITIN) 10 MG tablet; Take 1 tablet (10 mg total) by mouth daily.  Follow Up Instructions:    I  discussed the assessment and treatment plan with the patient. The patient was provided an opportunity to ask questions and all were answered. The patient agreed with the plan and demonstrated an understanding of the instructions.   The patient was advised to call back or seek an in-person evaluation if the symptoms worsen or if the condition fails to improve as anticipated.  I provided 14  minutes of non-face-to-face time during this encounter.   Kerin Perna, NP

## 2019-04-13 MED FILL — valACYclovir HCL 1 GM TABS: 1 | 30 days supply | Qty: 30 | Fill #6

## 2019-04-26 ENCOUNTER — Ambulatory Visit (INDEPENDENT_AMBULATORY_CARE_PROVIDER_SITE_OTHER): Payer: Self-pay | Admitting: Urology

## 2019-04-26 DIAGNOSIS — R351 Nocturia: Secondary | ICD-10-CM

## 2019-04-26 DIAGNOSIS — R3915 Urgency of urination: Secondary | ICD-10-CM

## 2019-05-12 ENCOUNTER — Ambulatory Visit: Payer: Self-pay | Admitting: Urology

## 2019-05-15 MED FILL — valACYclovir HCL 1 GM TABS: 1 | 30 days supply | Qty: 30 | Fill #7

## 2019-06-03 IMAGING — RF DG ESOPHAGUS
8 series · 21 of 24 positions shown · non-contrast
Comparison: Chest radiograph 09/01/2016

CLINICAL DATA: Chronic throat pain. Globus sensation. Epigastric
pain.

EXAM:
ESOPHOGRAM / BARIUM SWALLOW / BARIUM TABLET STUDY
TECHNIQUE: Combined double contrast and single contrast examination performed
using effervescent crystals, thick barium liquid, and thin barium
liquid. The patient was observed with fluoroscopy swallowing a 13 mm
barium sulphate tablet.
FLUOROSCOPY TIME:  Fluoroscopy Time:  1 minutes, 12 seconds
Radiation Exposure Index (if provided by the fluoroscopic device):
10 mGy
Number of Acquired Spot Images: 0

[Series 1: cp_standard · 0.34mm/px · 3 of 69 frames shown (1 of 8)]
[frame 1/69]
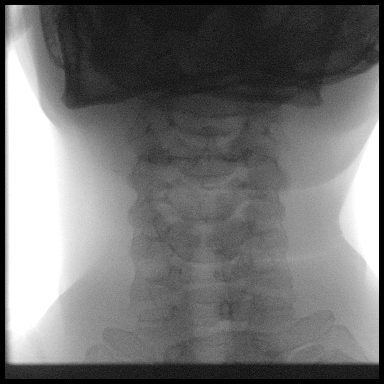
[frame 11/69]
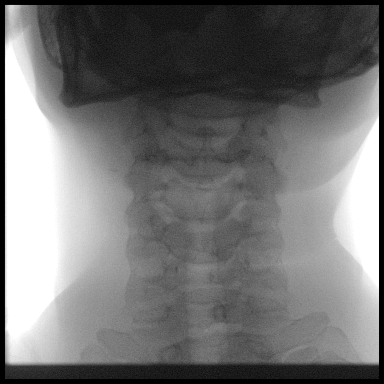
[frame 59/69]
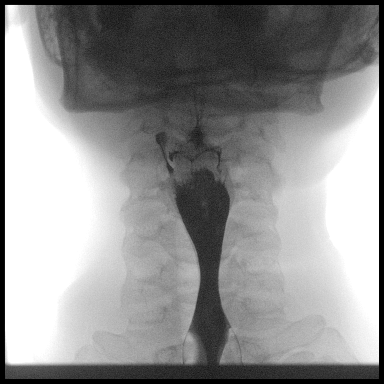

[Series 2: cp_standard · 0.34mm/px · 2 of 57 frames shown (2 of 8)]
[frame 29/57]
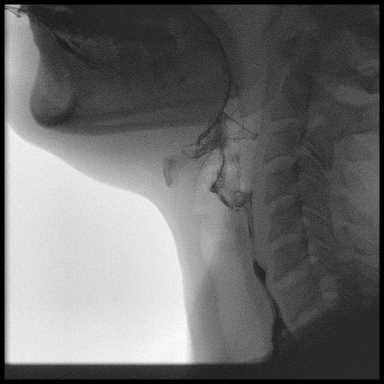
[frame 50/57]
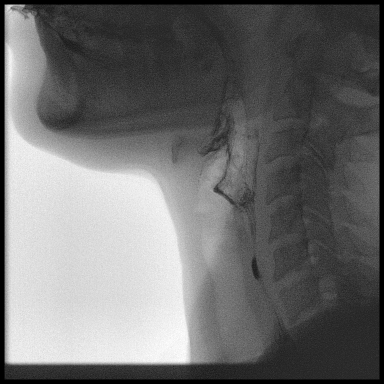

[Series 3: cp_standard · 0.34mm/px · 3 of 94 frames shown (3 of 8)]
[frame 15/94]
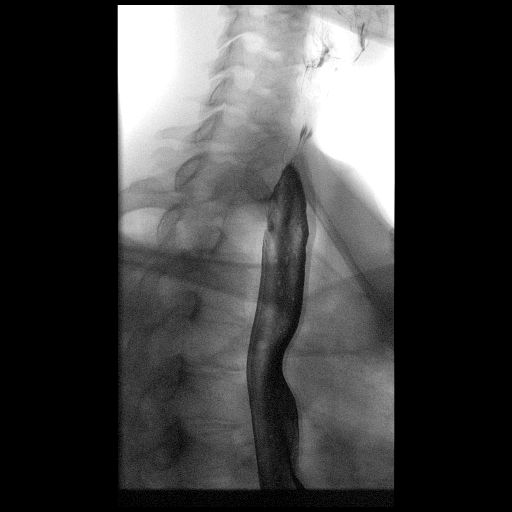
[frame 32/94]
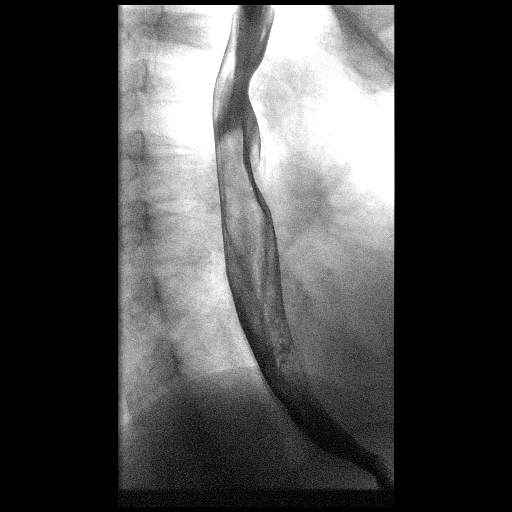
[frame 48/94]
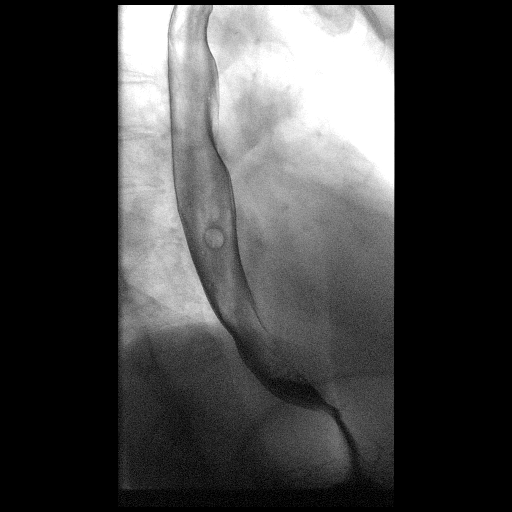

[Series 4: cp_standard · 0.34mm/px · 2 of 28 frames shown (4 of 8)]
[frame 5/28]
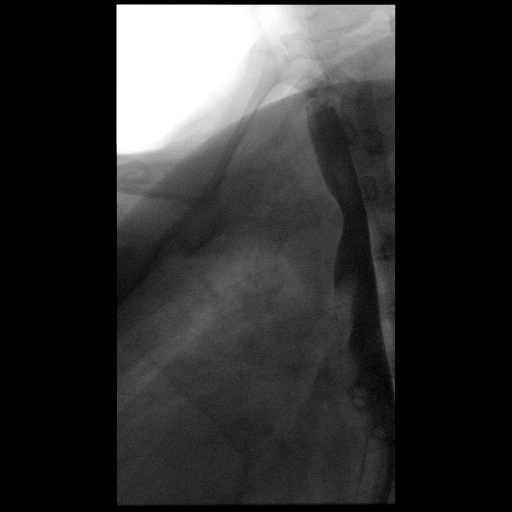
[frame 15/28]
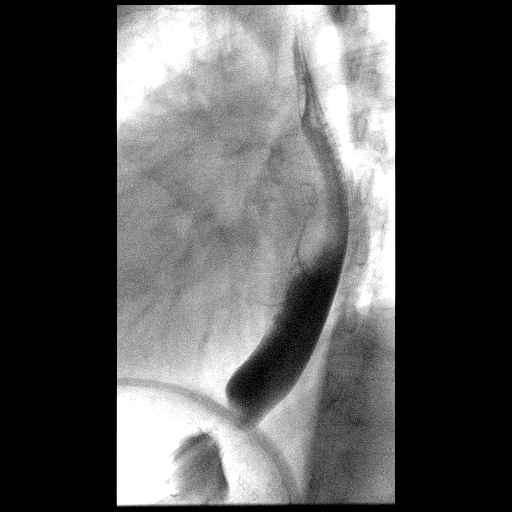

[Series 5: cp_standard · 0.34mm/px · 3 of 25 frames shown (5 of 8)]
[frame 4/25]
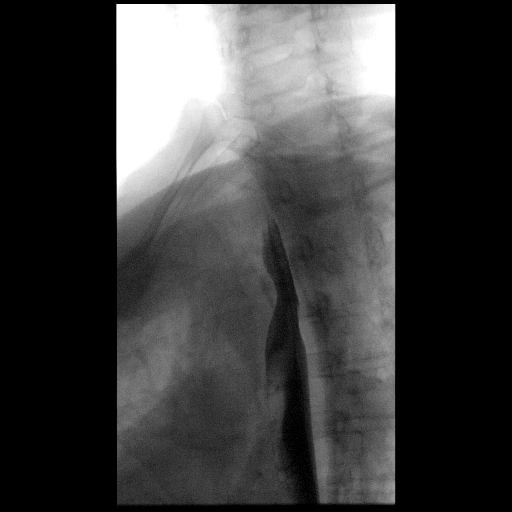
[frame 13/25]
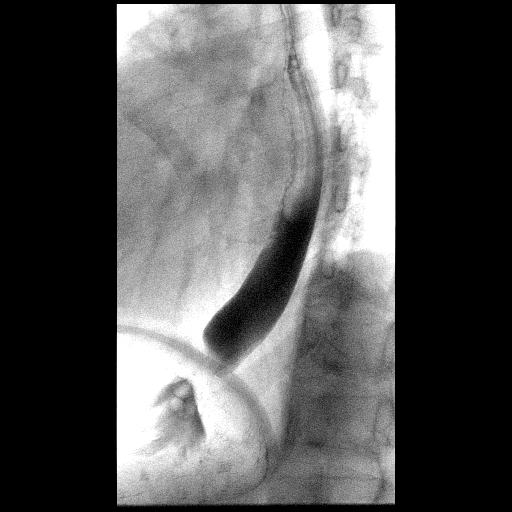
[frame 22/25]
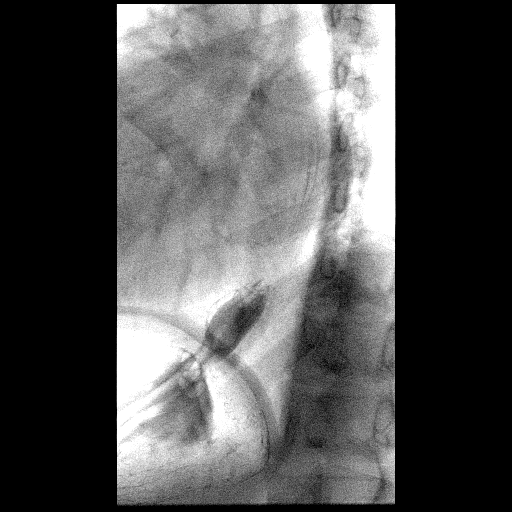

[Series 6: cp_standard · 0.34mm/px · 4 of 24 frames shown (6 of 8)]
[frame 3/24]
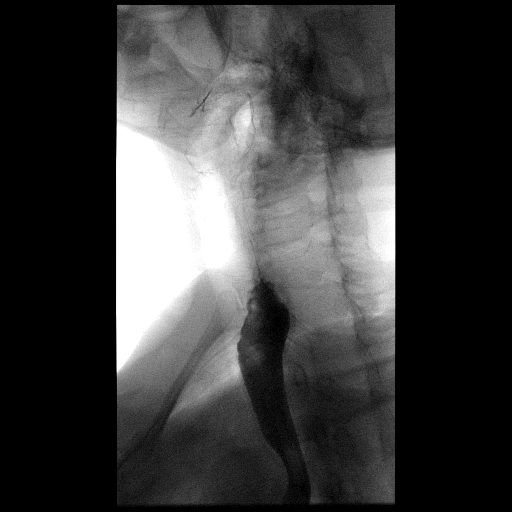
[frame 4/24]
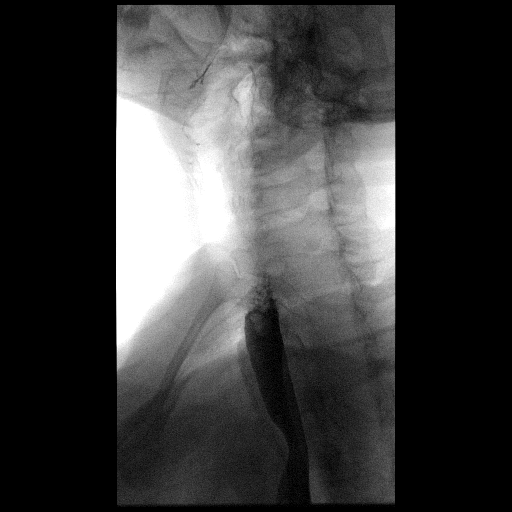
[frame 13/24]
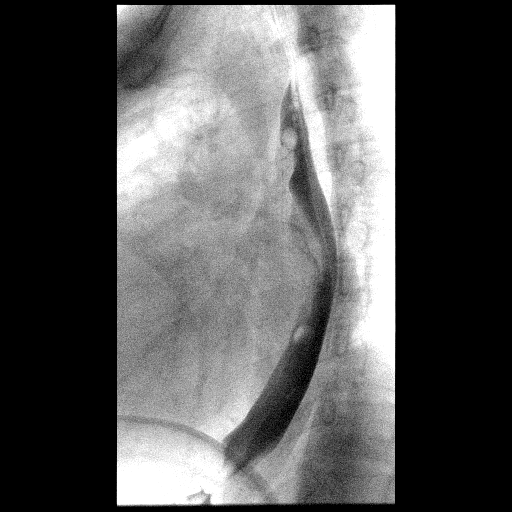
[frame 21/24]
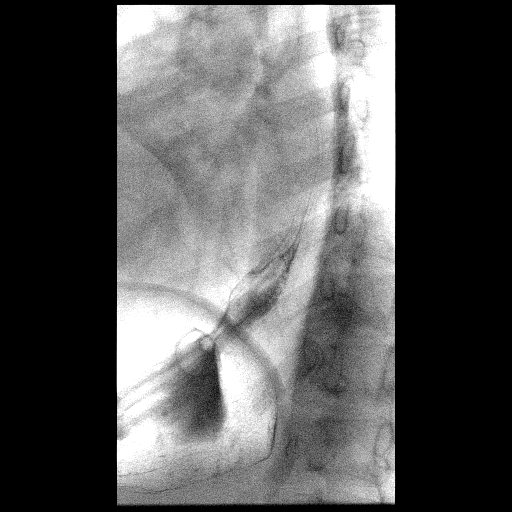

[Series 7: cp_standard · 0.34mm/px · 2 of 32 frames shown (7 of 8)]
[frame 17/32]
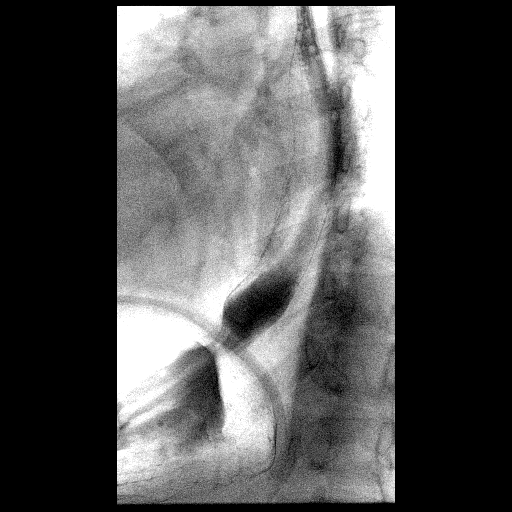
[frame 28/32]
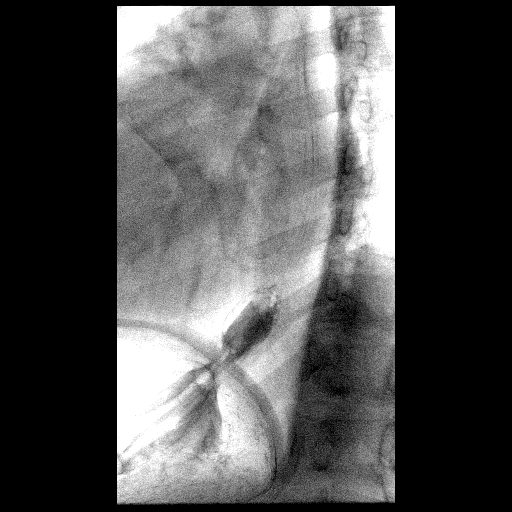

[Series 8: cp_standard · 0.36mm/px · 2 of 17 frames shown (8 of 8)]
[frame 9/17]
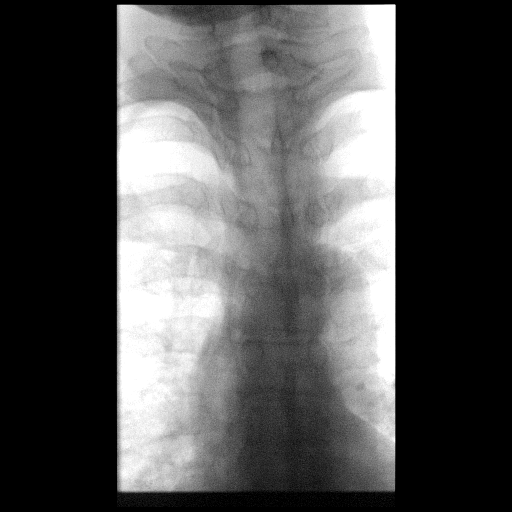
[frame 15/17]
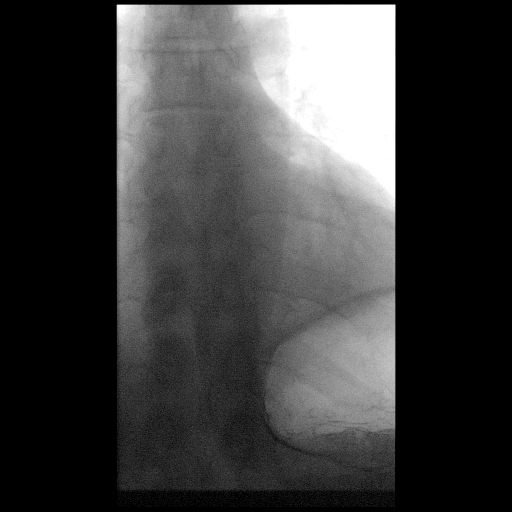

[21 of 24 positions shown; findings below may reference images not displayed]

FINDINGS: There is an episode of minimal laryngeal penetration during the
pharyngeal phase of swallowing.

Primary peristaltic waves in the esophagus were normal on [DATE]
swallows. No mucosal irregularity is observed in the esophagus. No
stricture.

A 13 mm barium tablet passed without difficulty into the stomach.
IMPRESSION: 1. No esophageal stricture or significant mucosal abnormality is
identified.
2. There is a single episode of faint laryngeal penetration of
contrast during the pharyngeal phase of swallowing.
3. No dysmotility.

## 2019-06-14 ENCOUNTER — Ambulatory Visit (INDEPENDENT_AMBULATORY_CARE_PROVIDER_SITE_OTHER): Payer: Self-pay | Admitting: Urology

## 2019-06-14 DIAGNOSIS — N393 Stress incontinence (female) (male): Secondary | ICD-10-CM

## 2019-06-14 DIAGNOSIS — R351 Nocturia: Secondary | ICD-10-CM

## 2019-06-14 DIAGNOSIS — N3281 Overactive bladder: Secondary | ICD-10-CM

## 2019-06-14 MED FILL — valACYclovir HCL 1 GM TABS: 1 | 30 days supply | Qty: 30 | Fill #8

## 2019-06-16 ENCOUNTER — Ambulatory Visit: Payer: Self-pay | Admitting: Urology

## 2019-07-18 ENCOUNTER — Other Ambulatory Visit: Payer: Self-pay | Admitting: Pharmacist

## 2019-07-18 MED ORDER — VALACYCLOVIR HCL 1 G PO TABS: 1000.0000 mg | ORAL_TABLET | Freq: Every day | ORAL | 0 refills | Status: DC

## 2019-07-18 MED FILL — valACYclovir HCL 1 GM TABS: 1 | 30 days supply | Qty: 30 | Fill #0

## 2019-08-09 ENCOUNTER — Ambulatory Visit (INDEPENDENT_AMBULATORY_CARE_PROVIDER_SITE_OTHER): Payer: Self-pay | Admitting: Urology

## 2019-08-09 ENCOUNTER — Encounter: Payer: Self-pay | Admitting: Urology

## 2019-08-09 ENCOUNTER — Other Ambulatory Visit: Payer: Self-pay

## 2019-08-09 VITALS — BP 136/81 | HR 75 | Temp 96.7°F | Ht 74.0 in | Wt 246.0 lb

## 2019-08-09 DIAGNOSIS — N393 Stress incontinence (female) (male): Secondary | ICD-10-CM

## 2019-08-09 DIAGNOSIS — N3281 Overactive bladder: Secondary | ICD-10-CM

## 2019-08-09 LAB — POCT URINALYSIS DIPSTICK
Bilirubin, UA: NEGATIVE
Glucose, UA: NEGATIVE
Ketones, UA: NEGATIVE
Leukocytes, UA: NEGATIVE
Nitrite, UA: NEGATIVE
Protein, UA: NEGATIVE
Spec Grav, UA: >=1.03 — AB (ref 1.010–1.025)
Urobilinogen, UA: NEGATIVE E.U./dL — AB
pH, UA: 5 (ref 5.0–8.0)

## 2019-08-09 NOTE — Progress Notes (Signed)
Urological Symptom Review  Patient is experiencing the following symptoms: Frequent urination Get up at night to urinate Leakage of urine   Review of Systems  Gastrointestinal (upper)  : Negative for upper GI symptoms  Gastrointestinal (lower) : Negative for lower GI symptoms  Constitutional : Night Sweats  Skin: Itching  Eyes: Blurred vision  Ear/Nose/Throat : Sinus problems  Hematologic/Lymphatic: Negative for Hematologic/Lymphatic symptoms  Cardiovascular : Chest pain  Respiratory : Negative for respiratory symptoms  Endocrine: Negative for endocrine symptoms  Musculoskeletal: Back pain  Neurological: Negative for neurological symptoms  Psychologic: Negative for psychiatric symptoms

## 2019-08-09 NOTE — Patient Instructions (Signed)

## 2019-08-09 NOTE — Progress Notes (Signed)
08/09/2019 10:17 AM   Haley Chandler 11-06-1970 [id number]  Referring provider: Kerin Perna, NP [address] [place],  [place] [address]  Urinary incontinence  HPI: Haley Chandler is a 49yo here for followup for SUI, OAB, and nocturia. She was unable to be seen due to being uninsured. She continues to have significant SUI and soaks 3-4 pads per day. OAB unimproved with multiple meds. No other associated symptoms. No exacerbating/allevaiitng events. OAb and SUI have been present for several years   PMH: Past Medical History:  Diagnosis Date  . GERD (gastroesophageal reflux disease)     Surgical History: Past Surgical History:  Procedure Laterality Date  . CHOLECYSTECTOMY N/A 12/05/2016   Procedure: LAPAROSCOPIC CHOLECYSTECTOMY WITH INTRAOPERATIVE CHOLANGIOGRAM;  Surgeon: Judeth Horn, MD;  Location: [place];  Service: General;  Laterality: N/A;  . TUBAL LIGATION      Home Medications:  Allergies as of 08/09/2019   No Known Allergies     Medication List       Accurate as of August 09, 2019 10:17 AM. If you have any questions, ask your nurse or doctor.        acetaminophen 500 MG tablet Commonly known as: TYLENOL Take 500 mg by mouth as needed.   Dexlansoprazole 30 MG capsule Take 1 capsule (30 mg total) by mouth daily.   dextromethorphan-guaiFENesin 30-600 MG 12hr tablet Commonly known as: MUCINEX DM Take 1 tablet by mouth 2 (two) times daily as needed for cough.   loratadine 10 MG tablet Commonly known as: CLARITIN Take 1 tablet (10 mg total) by mouth daily.   MULTIVITAMIN PO Take by mouth daily.   valACYclovir 1000 MG tablet Commonly known as: VALTREX Take 1 tablet (1,000 mg total) by mouth daily.       Allergies: No Known Allergies  Family History: Family History  Problem Relation Age of Onset  . Hypertension Mother   . Glaucoma Mother   . Hyperlipidemia Mother   . Diabetes Mother   . CAD Father        died of MI without prior hx.   .  Diabetes Sister   . Healthy Son   . Healthy Son     Social History:  reports that she has never smoked. She has never used smokeless tobacco. She reports current alcohol use. She reports that she does not use drugs.  ROS: All other review of systems were reviewed and are negative except what is noted above in HPI  Physical Exam: BP 136/81   Pulse 75   Temp (!) 96.7 F (35.9 C)   Ht 6\' 2"  (1.88 m)   Wt 246 lb (111.6 kg)   BMI 31.58 kg/m   Constitutional:  Alert and oriented, No acute distress. HEENT: [place] AT, moist mucus membranes.  Trachea midline, no masses. Cardiovascular: No clubbing, cyanosis, or edema. Respiratory: Normal respiratory effort, no increased work of breathing. GI: Abdomen is soft, nontender, nondistended, no abdominal masses GU: No CVA tenderness Lymph: No cervical or inguinal lymphadenopathy. Skin: No rashes, bruises or suspicious lesions. Neurologic: Grossly intact, no focal deficits, moving all 4 extremities. Psychiatric: Normal mood and affect.  Laboratory Data: Lab Results  Component Value Date   WBC 4.0 05/24/2018   HGB 12.3 05/24/2018   HCT 36.4 05/24/2018   MCV 92.2 05/24/2018   PLT 337 05/24/2018    Lab Results  Component Value Date   CREATININE 0.93 05/24/2018    No results found for: PSA  No results found for: TESTOSTERONE  Lab Results  Component Value Date   HGBA1C 5.4 04/07/2017    Urinalysis    Component Value Date/Time   COLORURINE STRAW (A) 12/04/2016 1805   APPEARANCEUR Clear 07/27/2018 1221   LABSPEC 1.004 (L) 12/04/2016 1805   PHURINE 7.0 12/04/2016 1805   GLUCOSEU Negative 07/27/2018 1221   HGBUR SMALL (A) 12/04/2016 1805   BILIRUBINUR neg 08/09/2019 1003   BILIRUBINUR Negative 07/27/2018 [place] 12/04/2016 1805   PROTEINUR Negative 08/09/2019 1003   PROTEINUR Negative 07/27/2018 1221   PROTEINUR NEGATIVE 12/04/2016 1805   UROBILINOGEN negative (A) 08/09/2019 1003   NITRITE neg 08/09/2019 1003     NITRITE Positive (A) 07/27/2018 1221   NITRITE NEGATIVE 12/04/2016 1805   LEUKOCYTESUR Negative 08/09/2019 1003   LEUKOCYTESUR 1+ (A) 07/27/2018 1221    Lab Results  Component Value Date   LABMICR See below: 07/27/2018   WBCUA 6-10 (A) 07/27/2018   RBCUA 0-2 07/27/2018   LABEPIT 0-10 07/27/2018   MUCUS Present 07/27/2018   BACTERIA Few 07/27/2018    Pertinent Imaging:  No results found for this or any previous visit. No results found for this or any previous visit. No results found for this or any previous visit. No results found for this or any previous visit. Results for orders placed during the hospital encounter of 02/11/18  US Renal   Narrative CLINICAL DATA:  Hydronephrosis.  EXAM: RENAL / URINARY TRACT ULTRASOUND COMPLETE  COMPARISON:  CT scan of January 18, 2018.  Ultrasound of Dec 04, 2016.  FINDINGS: Right Kidney:  Length: 11.8 cm. 1.2 cm simple cyst is noted in lower pole. Echogenicity within normal limits. Mild hydronephrosis is noted. No mass visualized.  Left Kidney:  Length: 11.7 cm. Echogenicity within normal limits. No mass or hydronephrosis visualized.  Bladder:  Appears normal for degree of bladder distention. Bilateral ureteral jets are noted.  IMPRESSION: Mild right hydronephrosis is noted. Simple right renal cyst is noted. No other abnormality seen.   Electronically Signed   By: Marijo Conception, M.D.   On: 02/11/2018 16:04    No results found for this or any previous visit. No results found for this or any previous visit. Results for orders placed during the hospital encounter of 01/18/18  CT RENAL STONE STUDY   Narrative CLINICAL DATA:  Left flank pain, low back pain  EXAM: CT ABDOMEN AND PELVIS WITHOUT CONTRAST  TECHNIQUE: Multidetector CT imaging of the abdomen and pelvis was performed following the standard protocol without IV contrast.  COMPARISON:  None.  FINDINGS: Lower chest: No acute abnormality.  Hepatobiliary:  No focal liver abnormality is seen. Status post cholecystectomy. No biliary dilatation.  Pancreas: Unremarkable. No pancreatic ductal dilatation or surrounding inflammatory changes.  Spleen: Normal in size without focal abnormality.  Adrenals/Urinary Tract: Adrenal glands are unremarkable. No renal mass. Punctate nonobstructing left renal calculus. 1-2 mm calcification in the left lower pelvis which may reflect a phlebolith versus a distal ureteral calculus. Mild right hydronephrosis. Bladder is unremarkable.  Stomach/Bowel: Stomach is within normal limits. Appendix appears normal. No evidence of bowel wall thickening, distention, or inflammatory changes.  Vascular/Lymphatic: No significant vascular findings are present. No enlarged abdominal or pelvic lymph nodes.  Reproductive: Uterus and bilateral adnexa are unremarkable.  Other: No abdominal wall hernia or abnormality. No abdominopelvic ascites.  Musculoskeletal: No acute or significant osseous findings. Mild osteoarthritis of bilateral hips.  IMPRESSION: 1. 1-2 mm calcification in the left lower pelvis which may reflect a phlebolith versus a  distal ureteral calculus. No left hydronephrosis. 2. Mild right hydronephrosis.   Electronically Signed   By: Kathreen Devoid   On: 01/19/2018 08:31     Assessment & Plan:    1. Overactive bladder -timed voiding - POCT urinalysis dipstick  2. SUI -Referral to [facility], Dr. Matilde Sprang  No follow-ups on file.  Nicolette Bang, MD  [facility] [place]

## 2019-08-15 ENCOUNTER — Other Ambulatory Visit: Payer: Self-pay | Admitting: Family Medicine

## 2019-08-16 ENCOUNTER — Other Ambulatory Visit (INDEPENDENT_AMBULATORY_CARE_PROVIDER_SITE_OTHER): Payer: Self-pay | Admitting: Primary Care

## 2019-08-16 MED ORDER — VALACYCLOVIR HCL 1 G PO TABS: 1000.0000 mg | ORAL_TABLET | Freq: Every day | ORAL | 3 refills | Status: DC

## 2019-08-16 MED FILL — valACYclovir HCL 1 GM TABS: 1 | 30 days supply | Qty: 30 | Fill #0

## 2019-08-21 ENCOUNTER — Ambulatory Visit (INDEPENDENT_AMBULATORY_CARE_PROVIDER_SITE_OTHER): Payer: Self-pay | Admitting: Primary Care

## 2019-08-22 ENCOUNTER — Encounter (INDEPENDENT_AMBULATORY_CARE_PROVIDER_SITE_OTHER): Payer: Self-pay | Admitting: Primary Care

## 2019-08-22 ENCOUNTER — Ambulatory Visit (INDEPENDENT_AMBULATORY_CARE_PROVIDER_SITE_OTHER): Payer: Self-pay | Admitting: Primary Care

## 2019-08-22 ENCOUNTER — Other Ambulatory Visit: Payer: Self-pay

## 2019-08-22 VITALS — BP 124/81 | HR 69 | Temp 97.2°F | Ht 74.0 in | Wt 245.4 lb

## 2019-08-22 DIAGNOSIS — R0789 Other chest pain: Secondary | ICD-10-CM

## 2019-08-22 DIAGNOSIS — B009 Herpesviral infection, unspecified: Secondary | ICD-10-CM

## 2019-08-22 DIAGNOSIS — Z6831 Body mass index (BMI) 31.0-31.9, adult: Secondary | ICD-10-CM

## 2019-08-22 NOTE — Patient Instructions (Signed)

## 2019-08-22 NOTE — Progress Notes (Signed)
Established Patient Office Visit  Subjective:  Patient ID: Haley Chandler, female    DOB: 1971-04-06  Age: 49 y.o. MRN: [id number]  CC:  Chief Complaint  Patient presents with  . Chest Pain    HPI Haley Chandler presents for was having chest pain called 911 ekg done SR, possiblr right atrial  Abnormality , incomplete RBBB and Bp 138/68 , HR 76, RR 16 temp 97.2, O2 room air 95% Repeated EKG NSR no abnormalities. Discussed s/s of MI. And what to do. History of panic attacks .  Past Medical History:  Diagnosis Date  . GERD (gastroesophageal reflux disease)     Past Surgical History:  Procedure Laterality Date  . CHOLECYSTECTOMY N/A 12/05/2016   Procedure: LAPAROSCOPIC CHOLECYSTECTOMY WITH INTRAOPERATIVE CHOLANGIOGRAM;  Surgeon: Judeth Horn, MD;  Location: [place];  Service: General;  Laterality: N/A;  . TUBAL LIGATION      Family History  Problem Relation Age of Onset  . Hypertension Mother   . Glaucoma Mother   . Hyperlipidemia Mother   . Diabetes Mother   . CAD Father        died of MI without prior hx.   . Diabetes Sister   . Healthy Son   . Healthy Son     Social History   Socioeconomic History  . Marital status: Single    Spouse name: Not on file  . Number of children: Not on file  . Years of education: Not on file  . Highest education level: Not on file  Occupational History  . Not on file  Tobacco Use  . Smoking status: Never Smoker  . Smokeless tobacco: Never Used  Substance and Sexual Activity  . Alcohol use: Yes    Comment: occ  . Drug use: No  . Sexual activity: Not Currently  Other Topics Concern  . Not on file  Social History Narrative  . Not on file   Social Determinants of Health   Financial Resource Strain:   . Difficulty of Paying Living Expenses: Not on file  Food Insecurity:   . Worried About Charity fundraiser in the Last Year: Not on file  . Ran Out of Food in the Last Year: Not on file  Transportation Needs:   . Lack of  Transportation (Medical): Not on file  . Lack of Transportation (Non-Medical): Not on file  Physical Activity:   . Days of Exercise per Week: Not on file  . Minutes of Exercise per Session: Not on file  Stress:   . Feeling of Stress : Not on file  Social Connections:   . Frequency of Communication with Friends and Family: Not on file  . Frequency of Social Gatherings with Friends and Family: Not on file  . Attends Religious Services: Not on file  . Active Member of Clubs or Organizations: Not on file  . Attends Archivist Meetings: Not on file  . Marital Status: Not on file  Intimate Partner Violence:   . Fear of Current or Ex-Partner: Not on file  . Emotionally Abused: Not on file  . Physically Abused: Not on file  . Sexually Abused: Not on file    Outpatient Medications Prior to Visit  Medication Sig Dispense Refill  . acetaminophen (TYLENOL) 500 MG tablet Take 500 mg by mouth as needed.    . Multiple Vitamin (MULTIVITAMIN PO) Take by mouth daily.    . valACYclovir (VALTREX) 1000 MG tablet Take 1 tablet (1,000 mg total) by mouth daily.  30 tablet 3  . Dexlansoprazole 30 MG capsule Take 1 capsule (30 mg total) by mouth daily. (Patient not taking: Reported on 08/09/2019) 30 capsule 1  . dextromethorphan-guaiFENesin (MUCINEX DM) 30-600 MG 12hr tablet Take 1 tablet by mouth 2 (two) times daily as needed for cough. (Patient not taking: Reported on 08/09/2019) 30 tablet 2  . loratadine (CLARITIN) 10 MG tablet Take 1 tablet (10 mg total) by mouth daily. (Patient not taking: Reported on 08/09/2019) 30 tablet 11   Facility-Administered Medications Prior to Visit  Medication Dose Route Frequency Provider Last Rate Last Admin  . 0.9 %  sodium chloride infusion  500 mL Intravenous Continuous Irene Shipper, MD        No Known Allergies  ROS Review of Systems  Cardiovascular: Positive for chest pain.  Psychiatric/Behavioral: The patient is nervous/anxious.   All other systems  reviewed and are negative.     Objective:    Physical Exam  Constitutional: She is oriented to person, place, and time. She appears well-developed and well-nourished.  HENT:  Head: Normocephalic.  Eyes: Pupils are equal, round, and reactive to light. EOM are normal.  Cardiovascular: Normal rate and regular rhythm.  Pulmonary/Chest: Effort normal and breath sounds normal.  Abdominal: Soft. Bowel sounds are normal.  Musculoskeletal:        General: Normal range of motion.     Cervical back: Neck supple.  Neurological: She is oriented to person, place, and time. She has normal reflexes.  Skin: Skin is warm and dry.  Psychiatric: She has a normal mood and affect. Her behavior is normal. Judgment and thought content normal.    BP 124/81 (BP Location: Left Arm, Patient Position: Sitting, Cuff Size: Large)   Pulse 69   Temp (!) 97.2 F (36.2 C) (Temporal)   Ht '6\' 2"'  (1.88 m)   Wt 245 lb 6.4 oz (111.3 kg)   LMP 08/02/2019 (Approximate)   SpO2 100%   BMI 31.51 kg/m  Wt Readings from Last 3 Encounters:  08/22/19 245 lb 6.4 oz (111.3 kg)  08/09/19 246 lb (111.6 kg)  03/07/19 243 lb (110.2 kg)   There are no preventive care reminders to display for this patient.  There are no preventive care reminders to display for this patient.  Lab Results  Component Value Date   TSH 2.000 10/02/2016   Lab Results  Component Value Date   WBC 4.0 05/24/2018   HGB 12.3 05/24/2018   HCT 36.4 05/24/2018   MCV 92.2 05/24/2018   PLT 337 05/24/2018   Lab Results  Component Value Date   NA 138 05/24/2018   K 4.5 05/24/2018   CO2 27 05/24/2018   GLUCOSE 80 05/24/2018   BUN 13 05/24/2018   CREATININE 0.93 05/24/2018   BILITOT 0.8 05/24/2018   ALKPHOS 32 (L) 01/12/2018   AST 17 05/24/2018   ALT 19 05/24/2018   PROT 7.4 07/27/2018   ALBUMIN 4.2 01/12/2018   CALCIUM 9.7 05/24/2018   ANIONGAP 8 04/06/2018   Lab Results  Component Value Date   CHOL 157 01/12/2018   Lab Results   Component Value Date   HDL 71 01/12/2018   Lab Results  Component Value Date   LDLCALC 78 01/12/2018   Lab Results  Component Value Date   TRIG 39 01/12/2018   Lab Results  Component Value Date   CHOLHDL 2.2 01/12/2018   Lab Results  Component Value Date   HGBA1C 5.4 04/07/2017      Assessment &  Plan:  Haley Chandler was seen today for chest pain.  Diagnoses and all orders for this visit:  Other chest pain Complaint of chest pain on right side no symptoms on left side and unable to replicate muscle skeletal muscle pain. Discussed symptoms of MI heaviness on chest , radiating pain to neck arm and back on the left side. Repeat EKG to rule out cardiac underlying causes.  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 This may lead to other co-morbidities examples are CVD, HTN, diabetes . Lifestyle modifications of diet and exercise may reduce obesity.      Follow-up: Return if symptoms worsen or fail to improve.    Kerin Perna, NP

## 2019-08-22 NOTE — Progress Notes (Signed)
Right side chest pain Pain comes from shoulder and down arm

## 2019-08-23 ENCOUNTER — Other Ambulatory Visit (INDEPENDENT_AMBULATORY_CARE_PROVIDER_SITE_OTHER): Payer: Self-pay

## 2019-08-23 DIAGNOSIS — R0789 Other chest pain: Secondary | ICD-10-CM

## 2019-08-24 LAB — CMP14+EGFR
ALT: 27 IU/L (ref 0–32)
AST: 21 IU/L (ref 0–40)
Albumin/Globulin Ratio: 1.4 (ref 1.2–2.2)
Albumin: 4.3 g/dL (ref 3.8–4.8)
Alkaline Phosphatase: 40 IU/L (ref 39–117)
BUN/Creatinine Ratio: 11 (ref 9–23)
BUN: 10 mg/dL (ref 6–24)
Bilirubin Total: 0.6 mg/dL (ref 0.0–1.2)
CO2: 21 mmol/L (ref 20–29)
Calcium: 9.8 mg/dL (ref 8.7–10.2)
Chloride: 103 mmol/L (ref 96–106)
Creatinine, Ser: 0.94 mg/dL (ref 0.57–1.00)
GFR calc Af Amer: 82 mL/min/{1.73_m2} (ref >59)
GFR calc non Af Amer: 71 mL/min/{1.73_m2} (ref >59)
Globulin, Total: 3 g/dL (ref 1.5–4.5)
Glucose: 86 mg/dL (ref 65–99)
Potassium: 4.4 mmol/L (ref 3.5–5.2)
Sodium: 139 mmol/L (ref 134–144)
Total Protein: 7.3 g/dL (ref 6.0–8.5)

## 2019-08-24 LAB — CBC WITH DIFFERENTIAL/PLATELET
Basophils Absolute: 0 10*3/uL (ref 0.0–0.2)
Basos: 0 %
EOS (ABSOLUTE): 0.1 10*3/uL (ref 0.0–0.4)
Eos: 2 %
Hematocrit: 37.8 % (ref 34.0–46.6)
Hemoglobin: 13.1 g/dL (ref 11.1–15.9)
Immature Grans (Abs): 0 10*3/uL (ref 0.0–0.1)
Immature Granulocytes: 0 %
Lymphocytes Absolute: 1.8 10*3/uL (ref 0.7–3.1)
Lymphs: 38 %
MCH: 32.4 pg (ref 26.6–33.0)
MCHC: 34.7 g/dL (ref 31.5–35.7)
MCV: 94 fL (ref 79–97)
Monocytes Absolute: 0.4 10*3/uL (ref 0.1–0.9)
Monocytes: 9 %
Neutrophils Absolute: 2.4 10*3/uL (ref 1.4–7.0)
Neutrophils: 51 %
Platelets: 364 10*3/uL (ref 150–450)
RBC: 4.04 x10E6/uL (ref 3.77–5.28)
RDW: 12.4 % (ref 11.7–15.4)
WBC: 4.7 10*3/uL (ref 3.4–10.8)

## 2019-08-24 LAB — LIPID PANEL
Chol/HDL Ratio: 2 ratio (ref 0.0–4.4)
Cholesterol, Total: 159 mg/dL (ref 100–199)
HDL: 78 mg/dL (ref >39)
LDL Chol Calc (NIH): 71 mg/dL (ref 0–99)
Triglycerides: 45 mg/dL (ref 0–149)
VLDL Cholesterol Cal: 10 mg/dL (ref 5–40)

## 2019-08-25 ENCOUNTER — Telehealth (INDEPENDENT_AMBULATORY_CARE_PROVIDER_SITE_OTHER): Payer: Self-pay

## 2019-08-25 NOTE — Telephone Encounter (Signed)
Patient is aware that all labs are normal. Romolo Sieling S [facility], CMA  

## 2019-08-25 NOTE — Telephone Encounter (Signed)
-----   Message from Kerin Perna, NP sent at 08/25/2019  8:49 AM EST ----- All labs are normal

## 2019-09-04 ENCOUNTER — Ambulatory Visit: Payer: Self-pay | Admitting: Urology

## 2019-09-14 ENCOUNTER — Ambulatory Visit: Payer: Self-pay | Attending: Internal Medicine

## 2019-09-14 DIAGNOSIS — Z23 Encounter for immunization: Secondary | ICD-10-CM | POA: Insufficient documentation

## 2019-09-14 NOTE — Progress Notes (Signed)
   Covid-19 Vaccination Clinic  Name:  Haley Chandler    MRN: [id number] DOB: 1971-07-07  09/14/2019  Ms. [name] was observed post Covid-19 immunization for 15 minutes without incident. She was provided with Vaccine Information Sheet and instruction to access the V-Safe system.   [name] was instructed to call 911 with any severe reactions post vaccine: Marland Kitchen Difficulty breathing  . Swelling of face and throat  . A fast heartbeat  . A bad rash all over body  . Dizziness and weakness   Immunizations Administered    Name Date Dose VIS Date Route   Pfizer COVID-19 Vaccine 09/14/2019 10:53 AM 0.3 mL 06/23/2019 Intramuscular   Manufacturer: [place]   Lot: [id number]   [place]: [id number]

## 2019-09-18 MED FILL — valACYclovir HCL 1 GM TABS: 1 | 30 days supply | Qty: 30 | Fill #1

## 2019-10-02 ENCOUNTER — Ambulatory Visit: Payer: Self-pay | Admitting: Urology

## 2019-10-11 ENCOUNTER — Ambulatory Visit: Payer: Self-pay | Attending: Internal Medicine

## 2019-10-11 DIAGNOSIS — Z23 Encounter for immunization: Secondary | ICD-10-CM

## 2019-10-11 NOTE — Progress Notes (Signed)
   Covid-19 Vaccination Clinic  Name:  Haley Chandler    MRN: [id number] DOB: 1971/01/31  10/11/2019  Ms. Leeder was observed post Covid-19 immunization for 15 minutes without incident. She was provided with Vaccine Information Sheet and instruction to access the V-Safe system.   Ms. Pavich was instructed to call 911 with any severe reactions post vaccine: Marland Kitchen Difficulty breathing  . Swelling of face and throat  . A fast heartbeat  . A bad rash all over body  . Dizziness and weakness   Immunizations Administered    Name Date Dose VIS Date Route   Pfizer COVID-19 Vaccine 10/11/2019  9:38 AM 0.3 mL 06/23/2019 Intramuscular   Manufacturer: [place]   Lot: [id number]   [place]: [id number]

## 2019-10-16 ENCOUNTER — Ambulatory Visit (INDEPENDENT_AMBULATORY_CARE_PROVIDER_SITE_OTHER): Payer: Self-pay | Admitting: Primary Care

## 2019-10-17 ENCOUNTER — Encounter (INDEPENDENT_AMBULATORY_CARE_PROVIDER_SITE_OTHER): Payer: Self-pay | Admitting: Primary Care

## 2019-10-17 ENCOUNTER — Other Ambulatory Visit: Payer: Self-pay

## 2019-10-17 ENCOUNTER — Ambulatory Visit (INDEPENDENT_AMBULATORY_CARE_PROVIDER_SITE_OTHER): Payer: Self-pay | Admitting: Primary Care

## 2019-10-17 VITALS — BP 130/83 | HR 64 | Temp 97.3°F | Ht 74.0 in | Wt 243.6 lb

## 2019-10-17 DIAGNOSIS — G609 Hereditary and idiopathic neuropathy, unspecified: Secondary | ICD-10-CM

## 2019-10-17 DIAGNOSIS — G8929 Other chronic pain: Secondary | ICD-10-CM

## 2019-10-17 DIAGNOSIS — R109 Unspecified abdominal pain: Secondary | ICD-10-CM

## 2019-10-17 DIAGNOSIS — I83893 Varicose veins of bilateral lower extremities with other complications: Secondary | ICD-10-CM

## 2019-10-17 MED ORDER — GABAPENTIN 100 MG PO CAPS: 100.0000 mg | ORAL_CAPSULE | Freq: Three times a day (TID) | ORAL | 3 refills | Status: DC

## 2019-10-17 MED FILL — GABAPENTIN 100 MG CAPSULE: 100 | 30 days supply | Qty: 90 | Fill #0

## 2019-10-17 MED FILL — valACYclovir HCL 1 GM TABS: 1 | 30 days supply | Qty: 30 | Fill #2

## 2019-10-17 NOTE — Patient Instructions (Signed)
Varicose Veins Varicose veins are veins that have become enlarged, bulged, and twisted. They most often appear in the legs. What are the causes? This condition is caused by damage to the valves in the vein. These valves help blood return to your heart. When they are damaged and they stop working properly, blood may flow backward and back up in the veins near the skin, causing the veins to get larger and appear twisted. The condition can result from any issue that causes blood to back up, like pregnancy, prolonged standing, or obesity. What increases the risk? This condition is more likely to develop in people who are:  On their feet a lot.  Pregnant.  Overweight. What are the signs or symptoms? Symptoms of this condition include:  Bulging, twisted, and bluish veins.  A feeling of heaviness. This may be worse at the end of the day.  Leg pain. This may be worse at the end of the day.  Swelling in the leg.  Changes in skin color over the veins. How is this diagnosed? This condition may be diagnosed based on your symptoms, a physical exam, and an ultrasound test. How is this treated? Treatment for this condition may involve:  Avoiding sitting or standing in one position for long periods of time.  Wearing compression stockings. These stockings help to prevent blood clots and reduce swelling in the legs.  Raising (elevating) the legs when resting.  Losing weight.  Exercising regularly. If you have persistent symptoms or want to improve the way your varicose veins look, you may choose to have a procedure to close the varicose veins off or to remove them. Treatments to close off the veins include:  Sclerotherapy. In this treatment, a solution is injected into a vein to close it off.  Laser treatment. In this treatment, the vein is heated with a laser to close it off.  Radiofrequency vein ablation. In this treatment, an electrical current produced by radio waves is used to close  off the vein. Treatments to remove the veins include:  Phlebectomy. In this treatment, the veins are removed through small incisions made over the veins.  Vein ligation and stripping. In this treatment, incisions are made over the veins. The veins are then removed after being tied (ligated) with stitches (sutures). Follow these instructions at home: Activity  Walk as much as possible. Walking increases blood flow. This helps blood return to the heart and takes pressure off your veins. It also increases your cardiovascular strength.  Follow your health care provider's instructions about exercising.  Do not stand or sit in one position for a long period of time.  Do not sit with your legs crossed.  Rest with your legs raised during the day. General instructions   Follow any diet instructions given to you by your health care provider.  Wear compression stockings as directed by your health care provider. Do not wear other kinds of tight clothing around your legs, pelvis, or waist.  Elevate your legs at night to above the level of your heart.  If you get a cut in the skin over the varicose vein and the vein bleeds: ? Lie down with your leg raised. ? Apply firm pressure to the cut with a clean cloth until the bleeding stops. ? Place a bandage (dressing) on the cut. Contact a health care provider if:  The skin around your varicose veins starts to break down.  You have pain, redness, tenderness, or hard swelling over a vein.  You   are uncomfortable because of pain.  You get a cut in the skin over a varicose vein and it will not stop bleeding. Summary  Varicose veins are veins that have become enlarged, bulged, and twisted. They most often appear in the legs.  This condition is caused by damage to the valves in the vein. These valves help blood return to your heart.  Treatment for this condition includes frequent movements, wearing compression stockings, losing weight, and  exercising regularly. In some cases, procedures are done to close off or remove the veins.  Treatment for this condition may include wearing compression stockings, elevating the legs, losing weight, and engaging in regular activity. In some cases, procedures are done to close off or remove the veins. This information is not intended to replace advice given to you by your health care provider. Make sure you discuss any questions you have with your health care provider. Document Revised: 08/25/2018 Document Reviewed: 07/22/2016 Elsevier Patient Education  2020 Elsevier Inc.  

## 2019-10-17 NOTE — Progress Notes (Signed)
Acute Office Visit  Subjective:    Patient ID: Haley Chandler, female    DOB: [birth year], 49 y.o.   MRN: [id number]  Chief Complaint  Patient presents with  . Pain    leg and feet   . Flank Pain    HPI Patient is in today for pain in her feet for the last 5 years but has become worse- if she stands for a period of time or sitting and feet swell. Explains feet feel hot and she gets chills. Flank pain on right side a hx of cholecystectomy pain has increased since removal of gall bladder. Patient admits to eating anything she wants and likes.  Past Medical History:  Diagnosis Date  . GERD (gastroesophageal reflux disease)     Past Surgical History:  Procedure Laterality Date  . CHOLECYSTECTOMY N/A 12/05/2016   Procedure: LAPAROSCOPIC CHOLECYSTECTOMY WITH INTRAOPERATIVE CHOLANGIOGRAM;  Surgeon: Judeth Horn, MD;  Location: [place];  Service: General;  Laterality: N/A;  . TUBAL LIGATION      Family History  Problem Relation Age of Onset  . Hypertension Mother   . Glaucoma Mother   . Hyperlipidemia Mother   . Diabetes Mother   . CAD Father        died of MI without prior hx.   . Diabetes Sister   . Healthy Son   . Healthy Son     Social History   Socioeconomic History  . Marital status: Single    Spouse name: Not on file  . Number of children: Not on file  . Years of education: Not on file  . Highest education level: Not on file  Occupational History  . Not on file  Tobacco Use  . Smoking status: Never Smoker  . Smokeless tobacco: Never Used  Substance and Sexual Activity  . Alcohol use: Yes    Comment: occ  . Drug use: No  . Sexual activity: Not Currently  Other Topics Concern  . Not on file  Social History Narrative  . Not on file   Social Determinants of Health   Financial Resource Strain:   . Difficulty of Paying Living Expenses:   Food Insecurity:   . Worried About Charity fundraiser in the Last Year:   . Arboriculturist in the Last Year:    Transportation Needs:   . Film/video editor (Medical):   Marland Kitchen Lack of Transportation (Non-Medical):   Physical Activity:   . Days of Exercise per Week:   . Minutes of Exercise per Session:   Stress:   . Feeling of Stress :   Social Connections:   . Frequency of Communication with Friends and Family:   . Frequency of Social Gatherings with Friends and Family:   . Attends Religious Services:   . Active Member of Clubs or Organizations:   . Attends Archivist Meetings:   Marland Kitchen Marital Status:   Intimate Partner Violence:   . Fear of Current or Ex-Partner:   . Emotionally Abused:   Marland Kitchen Physically Abused:   . Sexually Abused:     Outpatient Medications Prior to Visit  Medication Sig Dispense Refill  . acetaminophen (TYLENOL) 500 MG tablet Take 500 mg by mouth as needed.    . Multiple Vitamin (MULTIVITAMIN PO) Take by mouth daily.    . valACYclovir (VALTREX) 1000 MG tablet Take 1 tablet (1,000 mg total) by mouth daily. 30 tablet 3  . Dexlansoprazole 30 MG capsule Take 1 capsule (30 mg total)  by mouth daily. (Patient not taking: Reported on 08/09/2019) 30 capsule 1   Facility-Administered Medications Prior to Visit  Medication Dose Route Frequency Provider Last Rate Last Admin  . 0.9 %  sodium chloride infusion  500 mL Intravenous Continuous Irene Shipper, MD        No Known Allergies  Review of Systems  Gastrointestinal: Positive for abdominal distention.  Neurological: Positive for numbness.       Feet  All other systems reviewed and are negative.      Objective:    Physical Exam Vitals reviewed.  Constitutional:      Appearance: She is obese.  HENT:     Head: Normocephalic.  Cardiovascular:     Rate and Rhythm: Normal rate and regular rhythm.     Pulses: Normal pulses.     Heart sounds: Normal heart sounds.  Pulmonary:     Effort: Pulmonary effort is normal.     Breath sounds: Normal breath sounds.  Abdominal:     General: Bowel sounds are normal.   Musculoskeletal:        General: Normal range of motion.     Cervical back: Normal range of motion and neck supple.  Skin:    General: Skin is warm and dry.  Neurological:     Mental Status: She is alert and oriented to person, place, and time.  Psychiatric:        Mood and Affect: Mood normal.        Behavior: Behavior normal.        Thought Content: Thought content normal.        Judgment: Judgment normal.     BP 130/83 (BP Location: Left Arm, Patient Position: Sitting, Cuff Size: Large)   Pulse 64   Temp (!) 97.3 F (36.3 C) (Temporal)   Ht 6\' 2"  (1.88 m)   Wt 243 lb 9.6 oz (110.5 kg)   LMP 09/26/2019 (Approximate)   SpO2 100%   BMI 31.28 kg/m  Wt Readings from Last 3 Encounters:  10/17/19 243 lb 9.6 oz (110.5 kg)  08/22/19 245 lb 6.4 oz (111.3 kg)  08/09/19 246 lb (111.6 kg)    There are no preventive care reminders to display for this patient.  There are no preventive care reminders to display for this patient.   Lab Results  Component Value Date   TSH 2.000 10/02/2016   Lab Results  Component Value Date   WBC 4.7 08/23/2019   HGB 13.1 08/23/2019   HCT 37.8 08/23/2019   MCV 94 08/23/2019   PLT 364 08/23/2019   Lab Results  Component Value Date   NA 139 08/23/2019   K 4.4 08/23/2019   CO2 21 08/23/2019   GLUCOSE 86 08/23/2019   BUN 10 08/23/2019   CREATININE 0.94 08/23/2019   BILITOT 0.6 08/23/2019   ALKPHOS 40 08/23/2019   AST 21 08/23/2019   ALT 27 08/23/2019   PROT 7.3 08/23/2019   ALBUMIN 4.3 08/23/2019   CALCIUM 9.8 08/23/2019   ANIONGAP 8 04/06/2018   Lab Results  Component Value Date   CHOL 159 08/23/2019   Lab Results  Component Value Date   HDL 78 08/23/2019   Lab Results  Component Value Date   LDLCALC 71 08/23/2019   Lab Results  Component Value Date   TRIG 45 08/23/2019   Lab Results  Component Value Date   CHOLHDL 2.0 08/23/2019   Lab Results  Component Value Date   HGBA1C 5.4 04/07/2017  Assessment &  Plan:  Haley Chandler was seen today for pain and flank pain.  Diagnoses and all orders for this visit:  Hereditary and idiopathic peripheral neuropathy Explained Peripheral neuropathy develops slowly over time leading to a loss of sensation. Burning, stabbing, or aching pain in the legs or feet are common symptoms.This can lead , reduced ability to feel temperature changes. -     gabapentin (NEURONTIN) 100 MG capsule; Take 1 capsule (100 mg total) by mouth 3 (three) times daily.  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   Meds ordered this encounter  Medications  . gabapentin (NEURONTIN) 100 MG capsule    Sig: Take 1 capsule (100 mg total) by mouth 3 (three) times daily.    Dispense:  90 capsule    Refill:  [place], NP

## 2019-11-08 ENCOUNTER — Ambulatory Visit: Payer: Self-pay | Admitting: Urology

## 2019-11-28 MED FILL — GABAPENTIN 100 MG CAPSULE: 100 | 30 days supply | Qty: 90 | Fill #1

## 2019-11-28 MED FILL — valACYclovir HCL 1 GM TABS: 1 | 30 days supply | Qty: 30 | Fill #3

## 2019-12-25 ENCOUNTER — Ambulatory Visit: Payer: Self-pay | Admitting: Urology

## 2019-12-28 ENCOUNTER — Ambulatory Visit (INDEPENDENT_AMBULATORY_CARE_PROVIDER_SITE_OTHER): Payer: Self-pay | Admitting: Primary Care

## 2019-12-28 ENCOUNTER — Encounter (INDEPENDENT_AMBULATORY_CARE_PROVIDER_SITE_OTHER): Payer: Self-pay | Admitting: Primary Care

## 2019-12-28 ENCOUNTER — Other Ambulatory Visit: Payer: Self-pay

## 2019-12-28 VITALS — BP 124/74 | HR 60 | Temp 97.3°F | Ht 74.0 in | Wt 242.2 lb

## 2019-12-28 DIAGNOSIS — R7611 Nonspecific reaction to tuberculin skin test without active tuberculosis: Secondary | ICD-10-CM

## 2019-12-28 DIAGNOSIS — K219 Gastro-esophageal reflux disease without esophagitis: Secondary | ICD-10-CM

## 2019-12-28 DIAGNOSIS — K5900 Constipation, unspecified: Secondary | ICD-10-CM

## 2019-12-28 DIAGNOSIS — J301 Allergic rhinitis due to pollen: Secondary | ICD-10-CM

## 2019-12-28 MED ORDER — LORATADINE 10 MG PO TABS: 10.0000 mg | ORAL_TABLET | Freq: Every day | ORAL | 11 refills | Status: DC

## 2019-12-28 MED ORDER — PANTOPRAZOLE SODIUM 40 MG PO TBEC: 40.0000 mg | DELAYED_RELEASE_TABLET | Freq: Every day | ORAL | 3 refills | Status: DC

## 2019-12-28 MED ORDER — SENNA 8.6 MG PO TABS: 1.0000 | ORAL_TABLET | Freq: Every day | ORAL | 0 refills | Status: DC

## 2019-12-28 NOTE — Progress Notes (Signed)
Established Patient Office Visit  Subjective:  Patient ID: Haley Chandler, female    DOB: 1971/03/13  Age: 50 y.o. MRN: [id number]  CC:  Chief Complaint  Patient presents with   Abdominal Pain    HPI Haley Chandler is a 49 year old female  presents for abdominal pain with nausea  When she uses has a bowel movement she has upper gastric pain and lower abdomen hurts. Tried a cleansing for bowels and it worked however after she stopped abdomen pain return. Admits to not having bowel movement regularly but tires to eat fruits and vegetables and plenty of water. Denies blood in stool.    Past Medical History:  Diagnosis Date   GERD (gastroesophageal reflux disease)     Past Surgical History:  Procedure Laterality Date   CHOLECYSTECTOMY N/A 12/05/2016   Procedure: LAPAROSCOPIC CHOLECYSTECTOMY WITH INTRAOPERATIVE CHOLANGIOGRAM;  Surgeon: Judeth Horn, MD;  Location: [place];  Service: General;  Laterality: N/A;   TUBAL LIGATION      Family History  Problem Relation Age of Onset   Hypertension Mother    Glaucoma Mother    Hyperlipidemia Mother    Diabetes Mother    CAD Father        died of MI without prior hx.    Diabetes Sister    Healthy Son    Healthy Son     Social History   Socioeconomic History   Marital status: Single    Spouse name: Not on file   Number of children: Not on file   Years of education: Not on file   Highest education level: Not on file  Occupational History   Not on file  Tobacco Use   Smoking status: Never Smoker   Smokeless tobacco: Never Used  Vaping Use   Vaping Use: Never used  Substance and Sexual Activity   Alcohol use: Yes    Comment: occ   Drug use: No   Sexual activity: Not Currently  Other Topics Concern   Not on file  Social History Narrative   Not on file   Social Determinants of Health   Financial Resource Strain:    Difficulty of Paying Living Expenses:   Food Insecurity:    Worried About Paediatric nurse in the Last Year:    Arboriculturist in the Last Year:   Transportation Needs:    Film/video editor (Medical):    Lack of Transportation (Non-Medical):   Physical Activity:    Days of Exercise per Week:    Minutes of Exercise per Session:   Stress:    Feeling of Stress :   Social Connections:    Frequency of Communication with Friends and Family:    Frequency of Social Gatherings with Friends and Family:    Attends Religious Services:    Active Member of Clubs or Organizations:    Attends Music therapist:    Marital Status:   Intimate Partner Violence:    Fear of Current or Ex-Partner:    Emotionally Abused:    Physically Abused:    Sexually Abused:     Outpatient Medications Prior to Visit  Medication Sig Dispense Refill   gabapentin (NEURONTIN) 100 MG capsule Take 1 capsule (100 mg total) by mouth 3 (three) times daily. 90 capsule 3   Multiple Vitamin (MULTIVITAMIN PO) Take by mouth daily.     valACYclovir (VALTREX) 1000 MG tablet Take 1 tablet (1,000 mg total) by mouth daily. 30 tablet 3  acetaminophen (TYLENOL) 500 MG tablet Take 500 mg by mouth as needed.     Dexlansoprazole 30 MG capsule Take 1 capsule (30 mg total) by mouth daily. (Patient not taking: Reported on 12/28/2019) 30 capsule 1   0.9 %  sodium chloride infusion      No facility-administered medications prior to visit.    No Known Allergies  ROS Review of Systems  Constitutional:       Bloated all the time if eats or does not eat  Gastrointestinal: Positive for abdominal pain and constipation.  All other systems reviewed and are negative.     Objective:    Physical Exam Vitals reviewed.  Constitutional:      Appearance: She is well-developed. She is obese.  HENT:     Head: Normocephalic.  Cardiovascular:     Rate and Rhythm: Normal rate and regular rhythm.  Pulmonary:     Effort: Pulmonary effort is normal.     Breath sounds: Normal breath  sounds.  Abdominal:     General: Abdomen is protuberant.     Tenderness: There is generalized abdominal tenderness.  Neurological:     Mental Status: She is alert and oriented to person, place, and time.  Psychiatric:        Mood and Affect: Mood normal.     BP 124/74 (BP Location: Left Arm, Patient Position: Sitting, Cuff Size: Large)    Pulse 60    Temp (!) 97.3 F (36.3 C) (Temporal)    Ht 6\' 2"  (1.88 m)    Wt 242 lb 3.2 oz (109.9 kg)    SpO2 98%    BMI 31.10 kg/m  Wt Readings from Last 3 Encounters:  12/28/19 242 lb 3.2 oz (109.9 kg)  10/17/19 243 lb 9.6 oz (110.5 kg)  08/22/19 245 lb 6.4 oz (111.3 kg)     There are no preventive care reminders to display for this patient.  There are no preventive care reminders to display for this patient.  Lab Results  Component Value Date   TSH 2.000 10/02/2016   Lab Results  Component Value Date   WBC 4.7 08/23/2019   HGB 13.1 08/23/2019   HCT 37.8 08/23/2019   MCV 94 08/23/2019   PLT 364 08/23/2019   Lab Results  Component Value Date   NA 139 08/23/2019   K 4.4 08/23/2019   CO2 21 08/23/2019   GLUCOSE 86 08/23/2019   BUN 10 08/23/2019   CREATININE 0.94 08/23/2019   BILITOT 0.6 08/23/2019   ALKPHOS 40 08/23/2019   AST 21 08/23/2019   ALT 27 08/23/2019   PROT 7.3 08/23/2019   ALBUMIN 4.3 08/23/2019   CALCIUM 9.8 08/23/2019   ANIONGAP 8 04/06/2018   Lab Results  Component Value Date   CHOL 159 08/23/2019   Lab Results  Component Value Date   HDL 78 08/23/2019   Lab Results  Component Value Date   LDLCALC 71 08/23/2019   Lab Results  Component Value Date   TRIG 45 08/23/2019   Lab Results  Component Value Date   CHOLHDL 2.0 08/23/2019   Lab Results  Component Value Date   HGBA1C 5.4 04/07/2017      Assessment & Plan:  Haley Chandler was seen today for abdominal pain.  Diagnoses and all orders for this visit:  Constipation, unspecified constipation type Drink fluids in the recommended amount everyday.  Recommend amount is 8 cups of water daily. Do not replace water with Gatorade or Powerade as these should only be  used when you are dehydrated.  Eat lots of high fiber foods-fruits, veggies, bran and whole grain instead of white bread Be active everyday. Inactivity makes constipation worse.  senna (SENOKOT) 8.6 MG TABS tablet; Take 1 tablet (8.6 mg total) by mouth daily.   Gastroesophageal reflux disease without esophagitis Discussed eating small frequent meal, reduction in acidic foods, fried foods ,spicy foods, alcohol caffeine and tobacco and certain medications. Avoid laying down after eating 35mins-1hour, elevated head of the bed. -     pantoprazole (PROTONIX) 40 MG tablet; Take 1 tablet (40 mg total) by mouth daily.      seasonal allergic rhinitis due to pollen It appears that you are struggling with allergies.  Fortunately, this is a common condition and can usually be managed with medication. Claritin 10mg  daily/prn Positive PPD Follow-up: Return if symptoms worsen or fail to improve.    Kerin Perna, NP

## 2019-12-28 NOTE — Patient Instructions (Signed)
MANAGEMENT OF CHRONIC CONSTIPATION  Drink fluids in the recommended amount everyday. Recommend amount is 8 cups of water daily. Do not replace water with Gatorade or Powerade as these should only be used when you are dehydrated.  Eat lots of high fiber foods-fruits, veggies, bran and whole grain instead of white bread Be active everyday. Inactivity makes constipation worse. Add psyllium daily (Metamucil) which comes in capsules now. Start very low dose and work up to recommended dose on bottle daily. Stay away from Milk of Magnesia or any magnesium containing laxative, unless you need it to clear things out rarely. It is an addictive laxative and your gut will become dependent on it. If that is not working, I would start Miralax, which you can buy in generic 17 gms daily. It's a powder and not an "addictive laxative". Take it every day and titrate the dose up or down to get the daily Bm. We will consider the use of other pharmacological treatments should the above recommendations prove to be unsuccessful.   

## 2019-12-28 NOTE — Progress Notes (Signed)
Abdominal pain with nausea  When she uses bathroom lower abdomen hurts It is hard for her to eat pian makes her feel full

## 2019-12-29 MED FILL — PANTOPRAZOLE SOD DR 40 MG T: 40 | 30 days supply | Qty: 30 | Fill #0

## 2020-01-04 ENCOUNTER — Telehealth (INDEPENDENT_AMBULATORY_CARE_PROVIDER_SITE_OTHER): Payer: Self-pay

## 2020-01-04 NOTE — Telephone Encounter (Signed)
Patient called to request a refill for   valACYclovir (VALTREX) 1000 MG tablet   Patient uses   [facility] pharmacy   Please advice [phone]

## 2020-01-05 MED ORDER — VALACYCLOVIR HCL 1 G PO TABS: 1000.0000 mg | ORAL_TABLET | Freq: Every day | ORAL | 0 refills | Status: DC

## 2020-01-05 MED FILL — valACYclovir HCL 1 GM TABS: 1 | 30 days supply | Qty: 30 | Fill #0

## 2020-01-10 ENCOUNTER — Ambulatory Visit: Payer: Self-pay | Admitting: Urology

## 2020-01-10 ENCOUNTER — Ambulatory Visit (HOSPITAL_COMMUNITY)
Admission: RE | Admit: 2020-01-10 | Discharge: 2020-01-10 | Disposition: A | Discharge status: Home / Self Care | Payer: Self-pay | Source: Ambulatory Visit | Attending: Primary Care | Admitting: Primary Care

## 2020-01-10 ENCOUNTER — Ambulatory Visit (INDEPENDENT_AMBULATORY_CARE_PROVIDER_SITE_OTHER): Payer: Self-pay | Admitting: Urology

## 2020-01-10 ENCOUNTER — Other Ambulatory Visit: Payer: Self-pay

## 2020-01-10 ENCOUNTER — Encounter: Payer: Self-pay | Admitting: Urology

## 2020-01-10 VITALS — BP 116/75 | HR 64 | Temp 96.8°F | Ht 74.0 in | Wt 245.0 lb

## 2020-01-10 DIAGNOSIS — N3281 Overactive bladder: Secondary | ICD-10-CM

## 2020-01-10 DIAGNOSIS — R7611 Nonspecific reaction to tuberculin skin test without active tuberculosis: Secondary | ICD-10-CM | POA: Insufficient documentation

## 2020-01-10 DIAGNOSIS — N393 Stress incontinence (female) (male): Secondary | ICD-10-CM

## 2020-01-10 LAB — POCT URINALYSIS DIPSTICK
Bilirubin, UA: 1
Glucose, UA: NEGATIVE
Nitrite, UA: NEGATIVE
Protein, UA: NEGATIVE
Urobilinogen, UA: 0.2 E.U./dL
pH, UA: 6 (ref 5.0–8.0)

## 2020-01-10 NOTE — Progress Notes (Signed)
Urological Symptom Review  Patient is experiencing the following symptoms: Frequent urination Get up at night to urinate Leakage of urine Painful intercourse   Review of Systems  Gastrointestinal (upper)  : Indigestion/heartburn  Gastrointestinal (lower) : Constipation  Constitutional : Night Sweats  Skin: Itching  Eyes: Negative for eye symptoms  Ear/Nose/Throat : Sinus problems  Hematologic/Lymphatic: Negative for Hematologic/Lymphatic symptoms  Cardiovascular : Leg swelling Chest pain  Respiratory : Negative for respiratory symptoms  Endocrine: Negative for endocrine symptoms  Musculoskeletal: Joint pain  Neurological: Headaches  Psychologic: Anxiety

## 2020-01-10 NOTE — Patient Instructions (Signed)

## 2020-01-10 NOTE — Progress Notes (Signed)
01/10/2020 2:46 PM   Haley Chandler 04-02-1971 [id number]  Referring provider: Kerin Perna, NP [address] [place],  [place] [address]  Stress incontinence  HPI: Haley Chandler is a 49yo here for followup for SUi and OAB. She was scheduled at [facility] but cancelled 2 appointments due to lack of transportation. Since last visit her incontinence has worsened. She is uses 1-2 pads per day. Her nocturia has worsened to 3-4x. She has had no benefit from beta3s or anticholinergic.   PMH: Past Medical History:  Diagnosis Date  . GERD (gastroesophageal reflux disease)     Surgical History: Past Surgical History:  Procedure Laterality Date  . CHOLECYSTECTOMY N/A 12/05/2016   Procedure: LAPAROSCOPIC CHOLECYSTECTOMY WITH INTRAOPERATIVE CHOLANGIOGRAM;  Surgeon: Judeth Horn, MD;  Location: [place];  Service: General;  Laterality: N/A;  . TUBAL LIGATION      Home Medications:  Allergies as of 01/10/2020   No Known Allergies     Medication List       Accurate as of January 10, 2020  2:46 PM. If you have any questions, ask your nurse or doctor.        acetaminophen 500 MG tablet Commonly known as: TYLENOL Take 500 mg by mouth as needed.   Dexlansoprazole 30 MG capsule Take 1 capsule (30 mg total) by mouth daily.   gabapentin 100 MG capsule Commonly known as: NEURONTIN Take 1 capsule (100 mg total) by mouth 3 (three) times daily.   loratadine 10 MG tablet Commonly known as: CLARITIN Take 1 tablet (10 mg total) by mouth daily.   MULTIVITAMIN PO Take by mouth daily.   pantoprazole 40 MG tablet Commonly known as: PROTONIX Take 1 tablet (40 mg total) by mouth daily.   senna 8.6 MG Tabs tablet Commonly known as: SENOKOT Take 1 tablet (8.6 mg total) by mouth daily.   valACYclovir 1000 MG tablet Commonly known as: VALTREX Take 1 tablet (1,000 mg total) by mouth daily.       Allergies: No Known Allergies  Family History: Family History  Problem Relation Age of Onset  .  Hypertension Mother   . Glaucoma Mother   . Hyperlipidemia Mother   . Diabetes Mother   . CAD Father        died of MI without prior hx.   . Diabetes Sister   . Healthy Son   . Healthy Son     Social History:  reports that she has never smoked. She has never used smokeless tobacco. She reports current alcohol use. She reports that she does not use drugs.  ROS: All other review of systems were reviewed and are negative except what is noted above in HPI  Physical Exam: BP 116/75   Pulse 64   Temp (!) 96.8 F (36 C)   Ht 6\' 2"  (1.88 m)   Wt 245 lb (111.1 kg)   BMI 31.46 kg/m   Constitutional:  Alert and oriented, No acute distress. HEENT: [place] AT, moist mucus membranes.  Trachea midline, no masses. Cardiovascular: No clubbing, cyanosis, or edema. Respiratory: Normal respiratory effort, no increased work of breathing. GI: Abdomen is soft, nontender, nondistended, no abdominal masses GU: No CVA tenderness.  Lymph: No cervical or inguinal lymphadenopathy. Skin: No rashes, bruises or suspicious lesions. Neurologic: Grossly intact, no focal deficits, moving all 4 extremities. Psychiatric: Normal mood and affect.  Laboratory Data: Lab Results  Component Value Date   WBC 4.7 08/23/2019   HGB 13.1 08/23/2019   HCT 37.8 08/23/2019  MCV 94 08/23/2019   PLT 364 08/23/2019    Lab Results  Component Value Date   CREATININE 0.94 08/23/2019    No results found for: PSA  No results found for: TESTOSTERONE  Lab Results  Component Value Date   HGBA1C 5.4 04/07/2017    Urinalysis    Component Value Date/Time   COLORURINE STRAW (A) 12/04/2016 1805   APPEARANCEUR Clear 07/27/2018 1221   LABSPEC 1.004 (L) 12/04/2016 1805   PHURINE 7.0 12/04/2016 1805   GLUCOSEU Negative 07/27/2018 1221   HGBUR SMALL (A) 12/04/2016 1805   BILIRUBINUR 1 01/10/2020 1442   BILIRUBINUR Negative 07/27/2018 [facility] 12/04/2016 1805   PROTEINUR Negative 01/10/2020 1442    PROTEINUR Negative 07/27/2018 1221   PROTEINUR NEGATIVE 12/04/2016 1805   UROBILINOGEN 0.2 01/10/2020 1442   NITRITE negative 01/10/2020 1442   NITRITE Positive (A) 07/27/2018 1221   NITRITE NEGATIVE 12/04/2016 1805   LEUKOCYTESUR Trace (A) 01/10/2020 1442   LEUKOCYTESUR 1+ (A) 07/27/2018 1221    Lab Results  Component Value Date   LABMICR See below: 07/27/2018   WBCUA 6-10 (A) 07/27/2018   RBCUA 0-2 07/27/2018   LABEPIT 0-10 07/27/2018   MUCUS Present 07/27/2018   BACTERIA Few 07/27/2018    Pertinent Imaging:  No results found for this or any previous visit.  No results found for this or any previous visit.  No results found for this or any previous visit.  No results found for this or any previous visit.  Results for orders placed during the hospital encounter of 02/11/18  US Renal  Narrative CLINICAL DATA:  Hydronephrosis.  EXAM: RENAL / URINARY TRACT ULTRASOUND COMPLETE  COMPARISON:  CT scan of January 18, 2018.  Ultrasound of Dec 04, 2016.  FINDINGS: Right Kidney:  Length: 11.8 cm. 1.2 cm simple cyst is noted in lower pole. Echogenicity within normal limits. Mild hydronephrosis is noted. No mass visualized.  Left Kidney:  Length: 11.7 cm. Echogenicity within normal limits. No mass or hydronephrosis visualized.  Bladder:  Appears normal for degree of bladder distention. Bilateral ureteral jets are noted.  IMPRESSION: Mild right hydronephrosis is noted. Simple right renal cyst is noted. No other abnormality seen.   Electronically Signed By: Marijo Conception, M.D. On: 02/11/2018 16:04  No results found for this or any previous visit.  No results found for this or any previous visit.  Results for orders placed during the hospital encounter of 01/18/18  CT RENAL STONE STUDY  Narrative CLINICAL DATA:  Left flank pain, low back pain  EXAM: CT ABDOMEN AND PELVIS WITHOUT CONTRAST  TECHNIQUE: Multidetector CT imaging of the abdomen and pelvis  was performed following the standard protocol without IV contrast.  COMPARISON:  None.  FINDINGS: Lower chest: No acute abnormality.  Hepatobiliary: No focal liver abnormality is seen. Status post cholecystectomy. No biliary dilatation.  Pancreas: Unremarkable. No pancreatic ductal dilatation or surrounding inflammatory changes.  Spleen: Normal in size without focal abnormality.  Adrenals/Urinary Tract: Adrenal glands are unremarkable. No renal mass. Punctate nonobstructing left renal calculus. 1-2 mm calcification in the left lower pelvis which may reflect a phlebolith versus a distal ureteral calculus. Mild right hydronephrosis. Bladder is unremarkable.  Stomach/Bowel: Stomach is within normal limits. Appendix appears normal. No evidence of bowel wall thickening, distention, or inflammatory changes.  Vascular/Lymphatic: No significant vascular findings are present. No enlarged abdominal or pelvic lymph nodes.  Reproductive: Uterus and bilateral adnexa are unremarkable.  Other: No abdominal wall hernia or abnormality.  No abdominopelvic ascites.  Musculoskeletal: No acute or significant osseous findings. Mild osteoarthritis of bilateral hips.  IMPRESSION: 1. 1-2 mm calcification in the left lower pelvis which may reflect a phlebolith versus a distal ureteral calculus. No left hydronephrosis. 2. Mild right hydronephrosis.   Electronically Signed By: Kathreen Devoid On: 01/19/2018 08:31   Assessment & Plan:    1. Overactive bladder -Gemtesa trial - POCT urinalysis dipstick  2. SUI -Referral to [facility], Dr. Matilde Sprang   No follow-ups on file.  Nicolette Bang, MD  [facility] [place]

## 2020-01-19 ENCOUNTER — Telehealth (INDEPENDENT_AMBULATORY_CARE_PROVIDER_SITE_OTHER): Payer: Self-pay

## 2020-01-19 NOTE — Telephone Encounter (Signed)
Please advise on Xray results so that patient may be contacted and provided results. Nat Christen, CMA

## 2020-01-19 NOTE — Telephone Encounter (Signed)
Chest xray is clear no signs of infection

## 2020-01-22 NOTE — Telephone Encounter (Signed)
Patient is aware that chest xray was normal; no signs of infection.

## 2020-01-31 NOTE — Progress Notes (Deleted)
Office Visit Note  Patient: Haley Chandler             Date of Birth: [birth year]           MRN: [id number]             PCP: Kerin Perna, NP Referring: Kerin Perna, NP Visit Date: 02/14/2020 Occupation: @GUAROCC @  Subjective:  No chief complaint on file.   History of Present Illness: Haley Chandler is a 49 y.o. female ***   Activities of Daily Living:  Patient reports morning stiffness for *** {minute/hour:19697}.   Patient {ACTIONS;DENIES/REPORTS:21021675::"Denies"} nocturnal pain.  Difficulty dressing/grooming: {ACTIONS;DENIES/REPORTS:21021675::"Denies"} Difficulty climbing stairs: {ACTIONS;DENIES/REPORTS:21021675::"Denies"} Difficulty getting out of chair: {ACTIONS;DENIES/REPORTS:21021675::"Denies"} Difficulty using hands for taps, buttons, cutlery, and/or writing: {ACTIONS;DENIES/REPORTS:21021675::"Denies"}  No Rheumatology ROS completed.   PMFS History:  Patient Active Problem List   Diagnosis Date Noted  . Overactive bladder 08/09/2019  . SUI (stress urinary incontinence, female) 08/09/2019  . 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 Symptomatic cholelithiasis 12/05/2016  . Acute cholecystitis 12/04/2016    Past Medical History:  Diagnosis Date  . GERD (gastroesophageal reflux disease)     Family History  Problem Relation Age of Onset  . Hypertension Mother   . Glaucoma Mother   . Hyperlipidemia Mother   . Diabetes Mother   . CAD Father        died of MI without prior hx.   . Diabetes Sister   . Healthy Son   . Healthy Son    Past Surgical History:  Procedure Laterality Date  . CHOLECYSTECTOMY N/A 12/05/2016   Procedure: LAPAROSCOPIC CHOLECYSTECTOMY WITH INTRAOPERATIVE CHOLANGIOGRAM;  Surgeon: Judeth Horn, MD;  Location: [place];  Service: General;  Laterality: N/A;  . TUBAL LIGATION     Social History   Social History Narrative  . Not on file   Immunization History  Administered Date(s) Administered    . Influenza,inj,Quad PF,6+ Mos 05/12/2017, 05/06/2018, 03/21/2019  . PFIZER SARS-COV-2 Vaccination 09/14/2019, 10/11/2019  . PPD Test 05/09/2018  . Tdap 05/12/2017     Objective: Vital Signs: There were no vitals taken for this visit.   Physical Exam   Musculoskeletal Exam: ***  CDAI Exam: CDAI Score: -- Patient Global: --; Provider Global: -- Swollen: --; Tender: -- Joint Exam 02/14/2020   No joint exam has been documented for this visit   There is currently no information documented on the homunculus. Go to the Rheumatology activity and complete the homunculus joint exam.  Investigation: No additional findings.  Imaging: DG Chest 2 View  Result Date: 01/10/2020 CLINICAL DATA:  Provided history: Positive PPD. EXAM: CHEST - 2 VIEW COMPARISON:  Prior chest radiographs 06/01/2018 and earlier. FINDINGS: Heart size within normal limits. There is no appreciable airspace consolidation. No evidence of pleural effusion or pneumothorax. No acute bony abnormality identified. Thoracic spondylosis with a somewhat prominent lower thoracic ventral osteophyte. Surgical clips within the upper abdomen. IMPRESSION: No evidence of active cardiopulmonary disease. Electronically Signed   By: Kellie Simmering DO   On: 01/10/2020 15:15    Recent Labs: Lab Results  Component Value Date   WBC 4.7 08/23/2019   HGB 13.1 08/23/2019   PLT 364 08/23/2019   NA 139 08/23/2019   K 4.4 08/23/2019   CL 103 08/23/2019   CO2 21 08/23/2019   GLUCOSE 86 08/23/2019   BUN 10 08/23/2019   CREATININE 0.94 08/23/2019   BILITOT 0.6 08/23/2019   ALKPHOS 40 08/23/2019  AST 21 08/23/2019   ALT 27 08/23/2019   PROT 7.3 08/23/2019   ALBUMIN 4.3 08/23/2019   CALCIUM 9.8 08/23/2019   GFRAA 82 08/23/2019   QFTBGOLDPLUS Positive (A) 05/12/2018    Speciality Comments: No specialty comments available.  Procedures:  No procedures performed Allergies: Patient has no known allergies.   Assessment / Plan:      Visit Diagnoses: No diagnosis found.  Orders: No orders of the defined types were placed in this encounter.  No orders of the defined types were placed in this encounter.   Face-to-face time spent with patient was *** minutes. Greater than 50% of time was spent in counseling and coordination of care.  Follow-Up Instructions: No follow-ups on file.   Ofilia Neas, PA-C  Note - This record has been created using Dragon software.  Chart creation errors have been sought, but may not always  have been located. Such creation errors do not reflect on  the standard of medical care.

## 2020-02-07 ENCOUNTER — Other Ambulatory Visit (INDEPENDENT_AMBULATORY_CARE_PROVIDER_SITE_OTHER): Payer: Self-pay | Admitting: Family Medicine

## 2020-02-07 ENCOUNTER — Other Ambulatory Visit (INDEPENDENT_AMBULATORY_CARE_PROVIDER_SITE_OTHER): Payer: Self-pay | Admitting: Primary Care

## 2020-02-07 MED ORDER — VALACYCLOVIR HCL 1 G PO TABS: 1000.0000 mg | ORAL_TABLET | Freq: Every day | ORAL | 2 refills | Status: DC

## 2020-02-07 NOTE — Telephone Encounter (Signed)
Requested medication (s) are due for refill today: yes  Requested medication (s) are on the active medication list: yes  Future visit scheduled: no  Notes to clinic:  Patient states that she had appointment on 12/28/2019 And would like medication filled Please advise   Requested Prescriptions  Pending Prescriptions Disp Refills   valACYclovir (VALTREX) 1000 MG tablet 30 tablet 0    Sig: Take 1 tablet (1,000 mg total) by mouth daily.      Antimicrobials:  Antiviral Agents - Anti-Herpetic Passed - 02/07/2020 11:14 AM      Passed - Valid encounter within last 12 months    Recent Outpatient Visits           1 month ago Constipation, unspecified constipation type   [facility] Kerin Perna, NP   3 months ago Hereditary and idiopathic peripheral neuropathy    Kerin Perna, NP   5 months ago Other chest pain   [facility] Kerin Perna, NP   10 months ago Recurrent productive cough   [facility] Kerin Perna, NP   11 months ago Rheumatoid arthritis, involving unspecified site, unspecified rheumatoid factor presence ([place])   [place], [place], NP       Future Appointments             In 1 week [place] Blinda [place] [facility]   In 2 months McKenzie, Candee [place], MD [place]

## 2020-02-07 NOTE — Telephone Encounter (Signed)
Pt said the pharm told her to call her doctor. Pt last seen michelle on 12-28-2019. Pt would like a refill on valtrex 1000 mg send to [facility]

## 2020-02-08 MED FILL — valACYclovir HCL 1 GM TABS: 1 | 30 days supply | Qty: 30 | Fill #0

## 2020-02-14 ENCOUNTER — Ambulatory Visit: Payer: Self-pay | Admitting: Physician Assistant

## 2020-02-22 ENCOUNTER — Ambulatory Visit: Payer: Self-pay | Admitting: Rheumatology

## 2020-03-19 ENCOUNTER — Other Ambulatory Visit (INDEPENDENT_AMBULATORY_CARE_PROVIDER_SITE_OTHER): Payer: Self-pay | Admitting: Primary Care

## 2020-03-19 MED FILL — valACYclovir HCL 1 GM TABS: 1 | 30 days supply | Qty: 30 | Fill #1

## 2020-03-19 NOTE — Telephone Encounter (Signed)
Medication Refill - Medication: valACYclovir (VALTREX) 1000 MG tablet    Preferred Pharmacy (with phone number or street name):  [place], [address] Phone:  [phone]  Fax:  [phone]       Agent: Please be advised that RX refills may take up to 3 business days. We ask that you follow-up with your pharmacy.

## 2020-04-11 ENCOUNTER — Ambulatory Visit: Payer: Self-pay | Admitting: Urology

## 2020-04-12 ENCOUNTER — Other Ambulatory Visit: Payer: Self-pay | Admitting: *Deleted

## 2020-04-12 DIAGNOSIS — Z1231 Encounter for screening mammogram for malignant neoplasm of breast: Secondary | ICD-10-CM

## 2020-04-22 ENCOUNTER — Other Ambulatory Visit (INDEPENDENT_AMBULATORY_CARE_PROVIDER_SITE_OTHER): Payer: Self-pay | Admitting: Primary Care

## 2020-04-22 ENCOUNTER — Other Ambulatory Visit (INDEPENDENT_AMBULATORY_CARE_PROVIDER_SITE_OTHER): Payer: Self-pay | Admitting: Family Medicine

## 2020-04-22 MED ORDER — VALACYCLOVIR HCL 1 G PO TABS: 1000.0000 mg | ORAL_TABLET | Freq: Every day | ORAL | 2 refills | Status: DC

## 2020-04-22 MED FILL — valACYclovir HCL 1 GM TABS: 1 | 30 days supply | Qty: 30 | Fill #0

## 2020-04-22 NOTE — Telephone Encounter (Signed)
Medication Refill - Medication: valacyclovir   Has the patient contacted their pharmacy? Yes.   (Agent: If no, request that the patient contact the pharmacy for the refill.) (Agent: If yes, when and what did the pharmacy advise?)  Preferred Pharmacy (with phone number or street name):  [place], [address]  [place] [address]  Phone: [phone] Fax: [phone]  Hours: Not open 24 hours     Agent: Please be advised that RX refills may take up to 3 business days. We ask that you follow-up with your pharmacy.

## 2020-05-01 ENCOUNTER — Other Ambulatory Visit: Payer: Self-pay

## 2020-05-01 ENCOUNTER — Ambulatory Visit (INDEPENDENT_AMBULATORY_CARE_PROVIDER_SITE_OTHER): Payer: Self-pay | Admitting: Urology

## 2020-05-01 ENCOUNTER — Encounter: Payer: Self-pay | Admitting: Urology

## 2020-05-01 ENCOUNTER — Other Ambulatory Visit: Payer: Self-pay | Admitting: Urology

## 2020-05-01 VITALS — BP 134/75 | HR 65 | Temp 98.5°F | Ht 74.0 in | Wt 245.0 lb

## 2020-05-01 DIAGNOSIS — N3001 Acute cystitis with hematuria: Secondary | ICD-10-CM

## 2020-05-01 DIAGNOSIS — N393 Stress incontinence (female) (male): Secondary | ICD-10-CM

## 2020-05-01 LAB — URINALYSIS, ROUTINE W REFLEX MICROSCOPIC
Bilirubin, UA: NEGATIVE
Glucose, UA: NEGATIVE
Ketones, UA: NEGATIVE
Leukocytes,UA: NEGATIVE
Nitrite, UA: NEGATIVE
Protein,UA: NEGATIVE
Specific Gravity, UA: 1.01 (ref 1.005–1.030)
Urobilinogen, Ur: 0.2 mg/dL (ref 0.2–1.0)
pH, UA: 6 (ref 5.0–7.5)

## 2020-05-01 LAB — MICROSCOPIC EXAMINATION: Renal Epithel, UA: NONE SEEN /hpf

## 2020-05-01 MED ORDER — SULFAMETHOXAZOLE-TRIMETHOPRIM 800-160 MG PO TABS: 1.0000 | ORAL_TABLET | Freq: Two times a day (BID) | ORAL | 0 refills | Status: DC

## 2020-05-01 MED FILL — SULFAMETHOXAZOLE-TMP DS TAB: 800-160 | 7 days supply | Qty: 14 | Fill #0

## 2020-05-01 NOTE — Progress Notes (Signed)
05/01/2020 2:33 PM   [name] 1971/03/09 [id number]  Referring provider: Kerin Perna, NP [address] [place],  [place] [address]  followup SUI  HPI: Ms Loser is a 49yo here for followup for SUI. She cancelled her appointments at [facility] due to transportation issues. She is requesting to be seen in [place]. She continues to use 4-5 pads per day. She has tried Kegel exercises which failed to improve her SUI. She does have new dysuria and urgency today. UA is concerning for infection   PMH: Past Medical History:  Diagnosis Date  . GERD (gastroesophageal reflux disease)     Surgical History: Past Surgical History:  Procedure Laterality Date  . CHOLECYSTECTOMY N/A 12/05/2016   Procedure: LAPAROSCOPIC CHOLECYSTECTOMY WITH INTRAOPERATIVE CHOLANGIOGRAM;  Surgeon: Judeth Horn, MD;  Location: [place];  Service: General;  Laterality: N/A;  . TUBAL LIGATION      Home Medications:  Allergies as of 05/01/2020   No Known Allergies     Medication List       Accurate as of May 01, 2020  2:33 PM. If you have any questions, ask your nurse or doctor.        STOP taking these medications   Dexlansoprazole 30 MG capsule Stopped by: Nicolette Bang, MD   loratadine 10 MG tablet Commonly known as: CLARITIN Stopped by: Nicolette Bang, MD   senna 8.6 MG Tabs tablet Commonly known as: SENOKOT Stopped by: Nicolette Bang, MD     TAKE these medications   acetaminophen 500 MG tablet Commonly known as: TYLENOL Take 500 mg by mouth as needed.   gabapentin 100 MG capsule Commonly known as: NEURONTIN Take 1 capsule (100 mg total) by mouth 3 (three) times daily.   MULTIVITAMIN PO Take by mouth daily.   pantoprazole 40 MG tablet Commonly known as: PROTONIX Take 1 tablet (40 mg total) by mouth daily.   sulfamethoxazole-trimethoprim 800-160 MG tablet Commonly known as: BACTRIM DS Take 1 tablet by mouth every 12 (twelve) hours. Started by: Nicolette Bang, MD   valACYclovir 1000 MG tablet Commonly known as: VALTREX Take 1 tablet (1,000 mg total) by mouth daily.       Allergies: No Known Allergies  Family History: Family History  Problem Relation Age of Onset  . Hypertension Mother   . Glaucoma Mother   . Hyperlipidemia Mother   . Diabetes Mother   . CAD Father        died of MI without prior hx.   . Diabetes Sister   . Healthy Son   . Healthy Son     Social History:  reports that she has never smoked. She has never used smokeless tobacco. She reports current alcohol use. She reports that she does not use drugs.  ROS: All other review of systems were reviewed and are negative except what is noted above in HPI  Physical Exam: BP 134/75   Pulse 65   Temp 98.5 F (36.9 C)   Ht 6\' 2"  (1.88 m)   Wt 245 lb (111.1 kg)   BMI 31.46 kg/m   Constitutional:  Alert and oriented, No acute distress. HEENT: [place] AT, moist mucus membranes.  Trachea midline, no masses. Cardiovascular: No clubbing, cyanosis, or edema. Respiratory: Normal respiratory effort, no increased work of breathing. GI: Abdomen is soft, nontender, nondistended, no abdominal masses GU: No CVA tenderness.  Lymph: No cervical or inguinal lymphadenopathy. Skin: No rashes, bruises or suspicious lesions. Neurologic: Grossly intact, no focal deficits, moving all 4  extremities. Psychiatric: Normal mood and affect.  Laboratory Data: Lab Results  Component Value Date   WBC 4.7 08/23/2019   HGB 13.1 08/23/2019   HCT 37.8 08/23/2019   MCV 94 08/23/2019   PLT 364 08/23/2019    Lab Results  Component Value Date   CREATININE 0.94 08/23/2019    No results found for: PSA  No results found for: TESTOSTERONE  Lab Results  Component Value Date   HGBA1C 5.4 04/07/2017    Urinalysis    Component Value Date/Time   COLORURINE STRAW (A) 12/04/2016 1805   APPEARANCEUR Clear 05/01/2020 1338   LABSPEC 1.004 (L) 12/04/2016 1805   PHURINE 7.0 12/04/2016 1805    GLUCOSEU Negative 05/01/2020 1338   HGBUR SMALL (A) 12/04/2016 1805   BILIRUBINUR Negative 05/01/2020 [place] 12/04/2016 1805   PROTEINUR Negative 05/01/2020 [place] 12/04/2016 1805   UROBILINOGEN 0.2 01/10/2020 1442   NITRITE Negative 05/01/2020 1338   NITRITE NEGATIVE 12/04/2016 1805   LEUKOCYTESUR Negative 05/01/2020 1338    Lab Results  Component Value Date   LABMICR See below: 05/01/2020   WBCUA 0-5 05/01/2020   RBCUA 0-2 07/27/2018   LABEPIT 0-10 05/01/2020   MUCUS Present 07/27/2018   BACTERIA Few 05/01/2020    Pertinent Imaging:  No results found for this or any previous visit.  No results found for this or any previous visit.  No results found for this or any previous visit.  No results found for this or any previous visit.  Results for orders placed during the hospital encounter of 02/11/18  US Renal  Narrative CLINICAL DATA:  Hydronephrosis.  EXAM: RENAL / URINARY TRACT ULTRASOUND COMPLETE  COMPARISON:  CT scan of January 18, 2018.  Ultrasound of Dec 04, 2016.  FINDINGS: Right Kidney:  Length: 11.8 cm. 1.2 cm simple cyst is noted in lower pole. Echogenicity within normal limits. Mild hydronephrosis is noted. No mass visualized.  Left Kidney:  Length: 11.7 cm. Echogenicity within normal limits. No mass or hydronephrosis visualized.  Bladder:  Appears normal for degree of bladder distention. Bilateral ureteral jets are noted.  IMPRESSION: Mild right hydronephrosis is noted. Simple right renal cyst is noted. No other abnormality seen.   Electronically Signed By: Marijo Conception, M.D. On: 02/11/2018 16:04  No results found for this or any previous visit.  No results found for this or any previous visit.  Results for orders placed during the hospital encounter of 01/18/18  CT RENAL STONE STUDY  Narrative CLINICAL DATA:  Left flank pain, low back pain  EXAM: CT ABDOMEN AND PELVIS WITHOUT  CONTRAST  TECHNIQUE: Multidetector CT imaging of the abdomen and pelvis was performed following the standard protocol without IV contrast.  COMPARISON:  None.  FINDINGS: Lower chest: No acute abnormality.  Hepatobiliary: No focal liver abnormality is seen. Status post cholecystectomy. No biliary dilatation.  Pancreas: Unremarkable. No pancreatic ductal dilatation or surrounding inflammatory changes.  Spleen: Normal in size without focal abnormality.  Adrenals/Urinary Tract: Adrenal glands are unremarkable. No renal mass. Punctate nonobstructing left renal calculus. 1-2 mm calcification in the left lower pelvis which may reflect a phlebolith versus a distal ureteral calculus. Mild right hydronephrosis. Bladder is unremarkable.  Stomach/Bowel: Stomach is within normal limits. Appendix appears normal. No evidence of bowel wall thickening, distention, or inflammatory changes.  Vascular/Lymphatic: No significant vascular findings are present. No enlarged abdominal or pelvic lymph nodes.  Reproductive: Uterus and bilateral adnexa are unremarkable.  Other: No abdominal  wall hernia or abnormality. No abdominopelvic ascites.  Musculoskeletal: No acute or significant osseous findings. Mild osteoarthritis of bilateral hips.  IMPRESSION: 1. 1-2 mm calcification in the left lower pelvis which may reflect a phlebolith versus a distal ureteral calculus. No left hydronephrosis. 2. Mild right hydronephrosis.   Electronically Signed By: Kathreen Devoid On: 01/19/2018 08:31   Assessment & Plan:    1. SUI (stress urinary incontinence, female) -Referral to Dr. Matilde Sprang at [facility] - Urinalysis, Routine w reflex microscopic - Ambulatory referral to Urology  2. Acute cystitis with hematuria -bactrim DS BID for 7 days - Urine culture   No follow-ups on file.  Nicolette Bang, MD  [facility] [place]

## 2020-05-01 NOTE — Progress Notes (Signed)
Urological Symptom Review  Patient is experiencing the following symptoms: Get up at night to urinate Leakage of urine Painful intercourse   Review of Systems  Gastrointestinal (upper)  : Negative for upper GI symptoms  Gastrointestinal (lower) : Constipation  Constitutional : Night Sweats  Skin: Negative for skin symptoms  Eyes: Negative for eye symptoms  Ear/Nose/Throat : Sinus problems  Hematologic/Lymphatic: Negative for Hematologic/Lymphatic symptoms  Cardiovascular : Chest pain  Respiratory : Negative for respiratory symptoms  Endocrine: Negative for endocrine symptoms  Musculoskeletal: Negative for musculoskeletal symptoms  Neurological: Negative for neurological symptoms  Psychologic: Anxiety

## 2020-05-01 NOTE — Patient Instructions (Signed)

## 2020-05-02 ENCOUNTER — Other Ambulatory Visit: Payer: Self-pay

## 2020-05-02 ENCOUNTER — Other Ambulatory Visit: Payer: Self-pay | Admitting: *Deleted

## 2020-05-02 ENCOUNTER — Ambulatory Visit
Admission: RE | Admit: 2020-05-02 | Discharge: 2020-05-02 | Disposition: A | Discharge status: Home / Self Care | Payer: No Typology Code available for payment source | Source: Ambulatory Visit | Attending: Primary Care | Admitting: Primary Care

## 2020-05-02 VITALS — BP 141/81 | Wt 239.2 lb

## 2020-05-02 DIAGNOSIS — Z1231 Encounter for screening mammogram for malignant neoplasm of breast: Secondary | ICD-10-CM

## 2020-05-02 DIAGNOSIS — Z1239 Encounter for other screening for malignant neoplasm of breast: Secondary | ICD-10-CM

## 2020-05-02 DIAGNOSIS — Z1211 Encounter for screening for malignant neoplasm of colon: Secondary | ICD-10-CM

## 2020-05-02 NOTE — Progress Notes (Signed)
Ms. Kinlee Garrison is a 49 y.o. female who presents to [facility] clinic today with no complaints.    Pap Smear: Pap smear not completed today. Last Pap smear was 05/26/2017 at [facility] clinic and was normal. Per patient has no history of an abnormal Pap smear. Last Pap smear result is available in Epic.   Physical exam: Breasts Breasts symmetrical. No skin abnormalities bilateral breasts. No nipple retraction bilateral breasts. No nipple discharge bilateral breasts. No lymphadenopathy. No lumps palpated bilateral breasts. No complaints of pain or tenderness on exam.       Pelvic/Bimanual Pap is not indicated today per BCCCP guidelines.   Smoking History: Patient has never smoked.   Patient Navigation: Patient education provided. Access to services provided for patient through [place] program.   Colorectal Cancer Screening: Per patient has never had colonoscopy completed. FIT Test given to patient to complete. No complaints today.    Breast and Cervical Cancer Risk Assessment: Patient has family history of a maternal aunt having breast cancer. Patient has no known genetic mutations or history of radiation treatment to the chest before age 46. Patient does not have history of cervical dysplasia, immunocompromised, or DES exposure in-utero. Breast cancer risk assessment completed. No risk calculate due to assessment form not available in patients chart.   A: BCCCP exam without pap smear No complaints.  P: Referred patient to the [place] [facility] for a screening mammogram on mobile unit. Appointment scheduled Thursday, May 02, 2020 at 1550.  Loletta Parish, RN 05/02/2020 3:16 PM

## 2020-05-02 NOTE — Patient Instructions (Signed)
Explained breast self awareness with Bonnita Nasuti Anker. Patient did not need a Pap smear today due to last Pap smear was 05/26/2017. Let her know BCCCP will cover Pap smears every 3 years unless has a history of abnormal Pap smears. Patient scheduled for Pap smear 06/17/2020 at 1500 at the free cervical cancer screening. Referred patient to the [place] [facility] for a screening mammogram on mobile unit. Appointment scheduled Thursday, May 02, 2020 at 1550. Patient escorted to mobile unit following BCCCP appointment for her screening mammogram. Let patient know the [facility] will follow up with her within the next couple weeks with results of her mammogram by letter or phone. Twisha Hoopingarner verbalized understanding.  Nicholad Kautzman, Arvil Chaco, RN 3:16 PM

## 2020-05-03 LAB — URINE CULTURE: Organism ID, Bacteria: NO GROWTH

## 2020-05-07 ENCOUNTER — Other Ambulatory Visit: Payer: Self-pay | Admitting: Obstetrics and Gynecology

## 2020-05-07 DIAGNOSIS — R928 Other abnormal and inconclusive findings on diagnostic imaging of breast: Secondary | ICD-10-CM

## 2020-05-09 ENCOUNTER — Telehealth: Payer: Self-pay

## 2020-05-09 NOTE — Telephone Encounter (Signed)
-----   Message from Cleon Gustin, MD sent at 05/08/2020  6:37 PM EDT ----- normal ----- Message ----- From: Valentina Lucks, LPN Sent: 72/01/2181   9:21 AM EDT To: Cleon Gustin, MD  Pls review.

## 2020-05-09 NOTE — Telephone Encounter (Signed)
Pt notified culture neg.

## 2020-05-23 ENCOUNTER — Ambulatory Visit
Admission: RE | Admit: 2020-05-23 | Discharge: 2020-05-23 | Disposition: A | Discharge status: Home / Self Care | Payer: No Typology Code available for payment source | Source: Ambulatory Visit | Attending: Obstetrics and Gynecology | Admitting: Obstetrics and Gynecology

## 2020-05-23 ENCOUNTER — Other Ambulatory Visit: Payer: Self-pay

## 2020-05-23 DIAGNOSIS — R928 Other abnormal and inconclusive findings on diagnostic imaging of breast: Secondary | ICD-10-CM

## 2020-05-29 ENCOUNTER — Other Ambulatory Visit (INDEPENDENT_AMBULATORY_CARE_PROVIDER_SITE_OTHER): Payer: Self-pay | Admitting: Primary Care

## 2020-05-29 NOTE — Telephone Encounter (Signed)
Medication Refill - Medication: valACYclovir (VALTREX) 1000 MG tablet   Pt is requesting to "not have to call her PCP every month for a refill"  Has the patient contacted their pharmacy? Yes.   (Agent: If no, request that the patient contact the pharmacy for the refill.) (Agent: If yes, when and what did the pharmacy advise?)  Preferred Pharmacy (with phone number or street name):  [place], [place] [address]  [place] [address]  Phone: [phone] Fax: [phone]     Agent: Please be advised that RX refills may take up to 3 business days. We ask that you follow-up with your pharmacy.

## 2020-05-31 MED FILL — valACYclovir HCL 1 GM TABS: 1 | 30 days supply | Qty: 30 | Fill #1

## 2020-06-17 ENCOUNTER — Ambulatory Visit: Payer: Self-pay

## 2020-06-25 ENCOUNTER — Other Ambulatory Visit: Payer: Self-pay | Admitting: Obstetrics and Gynecology

## 2020-06-25 DIAGNOSIS — R928 Other abnormal and inconclusive findings on diagnostic imaging of breast: Secondary | ICD-10-CM

## 2020-06-28 MED FILL — valACYclovir HCL 1 GM TABS: 1 | 30 days supply | Qty: 30 | Fill #2

## 2020-07-18 ENCOUNTER — Telehealth: Payer: Self-pay | Admitting: Lactation Services

## 2020-07-18 NOTE — Telephone Encounter (Signed)
Patient call wanting to know when her scheduled appt is. Wants to be called back with date and time.

## 2020-07-18 NOTE — Telephone Encounter (Signed)
Message routed to [facility] as no appt scheduled for [facility] at this time.

## 2020-07-31 ENCOUNTER — Ambulatory Visit: Payer: Self-pay | Admitting: Urology

## 2020-08-02 MED FILL — valACYclovir HCL 1 GM TABS: 1 | 30 days supply | Qty: 30 | Fill #2

## 2020-08-05 ENCOUNTER — Other Ambulatory Visit: Payer: Self-pay | Admitting: *Deleted

## 2020-08-05 ENCOUNTER — Ambulatory Visit: Payer: No Typology Code available for payment source

## 2020-08-05 ENCOUNTER — Other Ambulatory Visit: Payer: Self-pay

## 2020-08-05 DIAGNOSIS — Z124 Encounter for screening for malignant neoplasm of cervix: Secondary | ICD-10-CM

## 2020-08-05 NOTE — Progress Notes (Signed)
Patient: Haley Chandler           Date of Birth: 12/25/1970           MRN: [id number] Visit Date: 08/05/2020 PCP: Kerin Perna, NP  Cervical Cancer Screening Do you smoke?: No Have you ever had or been told you have an allergy to latex products?: No Marital status: Single Date of last pap smear: 2-5 yrs ago  Cervical Exam  Abnormal Observations: Normal Exam Recommendations: Last Pap smear was 05/26/2017 at [facility] clinic and was normal. Per patient has no history of an abnormal Pap smear. Last Pap smear result is available in Epic. Let patient know if today's Pap smear is normal and HPV negative that her next Pap smear is due in 5 years. Let patient know that we will call her with her Pap smear results within the next couple of weeks.      Patient's History Patient Active Problem List   Diagnosis Date Noted  . Acute cystitis with hematuria 05/01/2020  . Overactive bladder 08/09/2019  . SUI (stress urinary incontinence, female) 08/09/2019  . 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 Symptomatic cholelithiasis 12/05/2016  . Acute cholecystitis 12/04/2016   Past Medical History:  Diagnosis Date  . GERD (gastroesophageal reflux disease)     Family History  Problem Relation Age of Onset  . Hypertension Mother   . Glaucoma Mother   . Hyperlipidemia Mother   . Diabetes Mother   . CAD Father        died of MI without prior hx.   . Diabetes Sister   . Healthy Son   . Healthy Son   . Breast cancer Maternal Aunt     Social History   Occupational History  . Not on file  Tobacco Use  . Smoking status: Never Smoker  . Smokeless tobacco: Never Used  Vaping Use  . Vaping Use: Never used  Substance and Sexual Activity  . Alcohol use: Yes    Comment: occ  . Drug use: No  . Sexual activity: Not Currently    Birth control/protection: None

## 2020-08-06 ENCOUNTER — Telehealth: Payer: Self-pay

## 2020-08-06 LAB — CYTOLOGY - PAP
Comment: NEGATIVE
Diagnosis: NEGATIVE
High risk HPV: NEGATIVE

## 2020-08-06 NOTE — Telephone Encounter (Signed)
Patient informed negative Pap/HPV results, next pap due in 5 years. Patient verbalized understanding.  

## 2020-08-08 ENCOUNTER — Encounter (INDEPENDENT_AMBULATORY_CARE_PROVIDER_SITE_OTHER): Payer: Self-pay | Admitting: Primary Care

## 2020-08-08 ENCOUNTER — Other Ambulatory Visit (INDEPENDENT_AMBULATORY_CARE_PROVIDER_SITE_OTHER): Payer: Self-pay | Admitting: Primary Care

## 2020-08-08 ENCOUNTER — Other Ambulatory Visit: Payer: Self-pay

## 2020-08-08 ENCOUNTER — Ambulatory Visit (INDEPENDENT_AMBULATORY_CARE_PROVIDER_SITE_OTHER): Payer: Self-pay | Admitting: Primary Care

## 2020-08-08 VITALS — BP 114/76 | HR 70 | Temp 97.5°F | Ht 74.0 in | Wt 233.6 lb

## 2020-08-08 DIAGNOSIS — K219 Gastro-esophageal reflux disease without esophagitis: Secondary | ICD-10-CM

## 2020-08-08 DIAGNOSIS — G609 Hereditary and idiopathic neuropathy, unspecified: Secondary | ICD-10-CM

## 2020-08-08 DIAGNOSIS — B009 Herpesviral infection, unspecified: Secondary | ICD-10-CM

## 2020-08-08 MED ORDER — VALACYCLOVIR HCL 1 G PO TABS: 1000.0000 mg | ORAL_TABLET | Freq: Every day | ORAL | 2 refills | Status: DC

## 2020-08-08 MED ORDER — GABAPENTIN 100 MG PO CAPS: 100.0000 mg | ORAL_CAPSULE | Freq: Three times a day (TID) | ORAL | 3 refills | Status: DC

## 2020-08-08 MED ORDER — PANTOPRAZOLE SODIUM 40 MG PO TBEC: 40.0000 mg | DELAYED_RELEASE_TABLET | Freq: Every day | ORAL | 3 refills | Status: DC

## 2020-08-08 NOTE — Progress Notes (Signed)
Established Patient Office Visit  Subjective:  Patient ID: Haley Chandler, female    DOB: 1971/02/12  Age: 50 y.o. MRN: [id number]  CC:  Chief Complaint  Patient presents with  . Medication Refill    HPI Ms. Haley Chandler is a 50 year old female in for follow up for refills of medications. She voices no complaints and concerns. Past Medical History:  Diagnosis Date  . GERD (gastroesophageal reflux disease)     Past Surgical History:  Procedure Laterality Date  . CHOLECYSTECTOMY N/A 12/05/2016   Procedure: LAPAROSCOPIC CHOLECYSTECTOMY WITH INTRAOPERATIVE CHOLANGIOGRAM;  Surgeon: Judeth Horn, MD;  Location: [place];  Service: General;  Laterality: N/A;  . TUBAL LIGATION      Family History  Problem Relation Age of Onset  . Hypertension Mother   . Glaucoma Mother   . Hyperlipidemia Mother   . Diabetes Mother   . CAD Father        died of MI without prior hx.   . Diabetes Sister   . Healthy Son   . Healthy Son   . Breast cancer Maternal Aunt     Social History   Socioeconomic History  . Marital status: Single    Spouse name: Not on file  . Number of children: Not on file  . Years of education: Not on file  . Highest education level: Not on file  Occupational History  . Not on file  Tobacco Use  . Smoking status: Never Smoker  . Smokeless tobacco: Never Used  Vaping Use  . Vaping Use: Never used  Substance and Sexual Activity  . Alcohol use: Yes    Comment: occ  . Drug use: No  . Sexual activity: Not Currently    Birth control/protection: None  Other Topics Concern  . Not on file  Social History Narrative  . Not on file   Social Determinants of Health   Financial Resource Strain: Not on file  Food Insecurity: Not on file  Transportation Needs: No Transportation Needs  . Lack of Transportation (Medical): No  . Lack of Transportation (Non-Medical): No  Physical Activity: Not on file  Stress: Not on file  Social Connections: Not on file  Intimate Partner  Violence: Not on file    Outpatient Medications Prior to Visit  Medication Sig Dispense Refill  . acetaminophen (TYLENOL) 500 MG tablet Take 500 mg by mouth as needed.    . Multiple Vitamin (MULTIVITAMIN PO) Take by mouth daily.    Marland Kitchen gabapentin (NEURONTIN) 100 MG capsule Take 1 capsule (100 mg total) by mouth 3 (three) times daily. 90 capsule 3  . pantoprazole (PROTONIX) 40 MG tablet Take 1 tablet (40 mg total) by mouth daily. 30 tablet 3  . valACYclovir (VALTREX) 1000 MG tablet Take 1 tablet (1,000 mg total) by mouth daily. 30 tablet 2  . sulfamethoxazole-trimethoprim (BACTRIM DS) 800-160 MG tablet Take 1 tablet by mouth every 12 (twelve) hours. 14 tablet 0   No facility-administered medications prior to visit.    No Known Allergies  ROS Review of Systems  All other systems reviewed and are negative.     Objective:    Physical Exam Vitals reviewed.  Constitutional:      Appearance: She is obese.  HENT:     Head: Normocephalic.     Right Ear: Tympanic membrane and external ear normal.     Left Ear: Tympanic membrane and external ear normal.     Nose: Nose normal.  Eyes:  Extraocular Movements: Extraocular movements intact.     Pupils: Pupils are equal, round, and reactive to light.  Cardiovascular:     Rate and Rhythm: Normal rate and regular rhythm.  Pulmonary:     Effort: Pulmonary effort is normal.     Breath sounds: Normal breath sounds.  Abdominal:     General: Bowel sounds are normal. There is distension.     Palpations: Abdomen is soft.  Musculoskeletal:        General: Normal range of motion.     Cervical back: Normal range of motion and neck supple.  Skin:    General: Skin is warm and dry.  Neurological:     Mental Status: She is alert and oriented to person, place, and time.  Psychiatric:        Mood and Affect: Mood normal.        Behavior: Behavior normal.        Thought Content: Thought content normal.        Judgment: Judgment normal.      BP 114/76 (BP Location: Left Arm, Patient Position: Sitting, Cuff Size: Large)   Pulse 70   Temp (!) 97.5 F (36.4 C) (Temporal)   Ht 6\' 2"  (1.88 m)   Wt 233 lb 9.6 oz (106 kg)   LMP 06/25/2020 (Approximate)   SpO2 100%   BMI 29.99 kg/m  Wt Readings from Last 3 Encounters:  08/08/20 233 lb 9.6 oz (106 kg)  05/02/20 239 lb 3.2 oz (108.5 kg)  05/01/20 245 lb (111.1 kg)     Health Maintenance Due  Topic Date Due  . COLONOSCOPY (Pts 45-55yrs Insurance coverage will need to be confirmed)  Never done  . COVID-19 Vaccine (3 - Booster for Pfizer series) 04/11/2020    There are no preventive care reminders to display for this patient.  Lab Results  Component Value Date   TSH 2.000 10/02/2016   Lab Results  Component Value Date   WBC 4.7 08/23/2019   HGB 13.1 08/23/2019   HCT 37.8 08/23/2019   MCV 94 08/23/2019   PLT 364 08/23/2019   Lab Results  Component Value Date   NA 139 08/23/2019   K 4.4 08/23/2019   CO2 21 08/23/2019   GLUCOSE 86 08/23/2019   BUN 10 08/23/2019   CREATININE 0.94 08/23/2019   BILITOT 0.6 08/23/2019   ALKPHOS 40 08/23/2019   AST 21 08/23/2019   ALT 27 08/23/2019   PROT 7.3 08/23/2019   ALBUMIN 4.3 08/23/2019   CALCIUM 9.8 08/23/2019   ANIONGAP 8 04/06/2018   Lab Results  Component Value Date   CHOL 159 08/23/2019   Lab Results  Component Value Date   HDL 78 08/23/2019   Lab Results  Component Value Date   LDLCALC 71 08/23/2019   Lab Results  Component Value Date   TRIG 45 08/23/2019   Lab Results  Component Value Date   CHOLHDL 2.0 08/23/2019   Lab Results  Component Value Date   HGBA1C 5.4 04/07/2017      Assessment & Plan:  [name] was seen today for medication refill.  Diagnoses and all orders for this visit:  Gastroesophageal reflux disease without esophagitis Discussed eating small frequent meal, reduction in acidic foods, fried foods ,spicy foods, alcohol caffeine and tobacco and certain medications. Avoid  laying down after eating 49mins-1hour, elevated head of the bed.  -     pantoprazole (PROTONIX) 40 MG tablet; Take 1 tablet (40 mg total) by mouth daily.  Hereditary and idiopathic peripheral neuropathy -     gabapentin (NEURONTIN) 100 MG capsule; Take 1 capsule (100 mg total) by mouth 3 (three) times daily.  HSV-2 infection -     valACYclovir (VALTREX) 1000 MG tablet; Take 1 tablet (1,000 mg total) by mouth daily.    Meds ordered this encounter  Medications  . pantoprazole (PROTONIX) 40 MG tablet    Sig: Take 1 tablet (40 mg total) by mouth daily.    Dispense:  90 tablet    Refill:  3  . gabapentin (NEURONTIN) 100 MG capsule    Sig: Take 1 capsule (100 mg total) by mouth 3 (three) times daily.    Dispense:  90 capsule    Refill:  3  . valACYclovir (VALTREX) 1000 MG tablet    Sig: Take 1 tablet (1,000 mg total) by mouth daily.    Dispense:  90 tablet    Refill:  2    Follow-up: No follow-ups on file.    Kerin Perna, NP

## 2020-08-08 NOTE — Patient Instructions (Signed)
Gastroesophageal Reflux Disease, Adult  Gastroesophageal reflux (GER) happens when acid from the stomach flows up into the tube that connects the mouth and the stomach (esophagus). Normally, food travels down the esophagus and stays in the stomach to be digested. With GER, food and stomach acid sometimes move back up into the esophagus. You may have a disease called gastroesophageal reflux disease (GERD) if the reflux:  Happens often.  Causes frequent or very bad symptoms.  Causes problems such as damage to the esophagus. When this happens, the esophagus becomes sore and swollen. Over time, GERD can make small holes (ulcers) in the lining of the esophagus. What are the causes? This condition is caused by a problem with the muscle between the esophagus and the stomach. When this muscle is weak or not normal, it does not close properly to keep food and acid from coming back up from the stomach. The muscle can be weak because of:  Tobacco use.  Pregnancy.  Having a certain type of hernia (hiatal hernia).  Alcohol use.  Certain foods and drinks, such as coffee, chocolate, onions, and peppermint. What increases the risk?  Being overweight.  Having a disease that affects your connective tissue.  Taking NSAIDs, such a ibuprofen. What are the signs or symptoms?  Heartburn.  Difficult or painful swallowing.  The feeling of having a lump in the throat.  A bitter taste in the mouth.  Bad breath.  Having a lot of saliva.  Having an upset or bloated stomach.  Burping.  Chest pain. Different conditions can cause chest pain. Make sure you see your doctor if you have chest pain.  Shortness of breath or wheezing.  A long-term cough or a cough at night.  Wearing away of the surface of teeth (tooth enamel).  Weight loss. How is this treated?  Making changes to your diet.  Taking medicine.  Having surgery. Treatment will depend on how bad your symptoms are. Follow these  instructions at home: Eating and drinking  Follow a diet as told by your doctor. You may need to avoid foods and drinks such as: ? Coffee and tea, with or without caffeine. ? Drinks that contain alcohol. ? Energy drinks and sports drinks. ? Bubbly (carbonated) drinks or sodas. ? Chocolate and cocoa. ? Peppermint and mint flavorings. ? Garlic and onions. ? Horseradish. ? Spicy and acidic foods. These include peppers, chili powder, curry powder, vinegar, hot sauces, and BBQ sauce. ? Citrus fruit juices and citrus fruits, such as oranges, lemons, and limes. ? Tomato-based foods. These include red sauce, chili, salsa, and pizza with red sauce. ? Fried and fatty foods. These include donuts, french fries, potato chips, and high-fat dressings. ? High-fat meats. These include hot dogs, rib eye steak, sausage, ham, and bacon. ? High-fat dairy items, such as whole milk, butter, and cream cheese.  Eat small meals often. Avoid eating large meals.  Avoid drinking large amounts of liquid with your meals.  Avoid eating meals during the 2-3 hours before bedtime.  Avoid lying down right after you eat.  Do not exercise right after you eat.   Lifestyle  Do not smoke or use any products that contain nicotine or tobacco. If you need help quitting, ask your doctor.  Try to lower your stress. If you need help doing this, ask your doctor.  If you are overweight, lose an amount of weight that is healthy for you. Ask your doctor about a safe weight loss goal.   General instructions  Pay attention to any changes in your symptoms.  Take over-the-counter and prescription medicines only as told by your doctor.  Do not take aspirin, ibuprofen, or other NSAIDs unless your doctor says it is okay.  Wear loose clothes. Do not wear anything tight around your waist.  Raise (elevate) the head of your bed about 6 inches (15 cm). You may need to use a wedge to do this.  Avoid bending over if this makes your  symptoms worse.  Keep all follow-up visits. Contact a doctor if:  You have new symptoms.  You lose weight and you do not know why.  You have trouble swallowing or it hurts to swallow.  You have wheezing or a cough that keeps happening.  You have a hoarse voice.  Your symptoms do not get better with treatment. Get help right away if:  You have sudden pain in your arms, neck, jaw, teeth, or back.  You suddenly feel sweaty, dizzy, or light-headed.  You have chest pain or shortness of breath.  You vomit and the vomit is green, yellow, or black, or it looks like blood or coffee grounds.  You faint.  Your poop (stool) is red, bloody, or black.  You cannot swallow, drink, or eat. These symptoms may represent a serious problem that is an emergency. Do not wait to see if the symptoms will go away. Get medical help right away. Call your local emergency services (911 in the [place] Do not drive yourself to the hospital. Summary  If a person has gastroesophageal reflux disease (GERD), food and stomach acid move back up into the esophagus and cause symptoms or problems such as damage to the esophagus.  Treatment will depend on how bad your symptoms are.  Follow a diet as told by your doctor.  Take all medicines only as told by your doctor. This information is not intended to replace advice given to you by your health care provider. Make sure you discuss any questions you have with your health care provider. Document Revised: 01/08/2020 Document Reviewed: 01/08/2020 Elsevier Patient Education  [employer].

## 2020-08-09 MED FILL — GABAPENTIN 100 MG CAPSULE: 100 | 30 days supply | Qty: 90 | Fill #0

## 2020-08-09 MED FILL — PANTOPRAZOLE SOD DR 40 MG T: 40 | 30 days supply | Qty: 30 | Fill #0

## 2020-09-06 ENCOUNTER — Ambulatory Visit: Payer: Self-pay | Admitting: Urology

## 2020-09-09 MED FILL — valACYclovir HCL 1 GM TABS: 1 | 30 days supply | Qty: 30 | Fill #0

## 2020-09-09 MED FILL — PANTOPRAZOLE SOD DR 40 MG T: 40 | 30 days supply | Qty: 30 | Fill #1

## 2020-10-12 ENCOUNTER — Other Ambulatory Visit: Payer: Self-pay

## 2020-10-15 ENCOUNTER — Other Ambulatory Visit: Payer: Self-pay

## 2020-10-15 MED FILL — Valacyclovir HCl Tab 1 GM: ORAL | 30 days supply | Qty: 30 | Fill #0 | Status: AC

## 2020-10-17 ENCOUNTER — Other Ambulatory Visit: Payer: Self-pay

## 2020-11-08 ENCOUNTER — Other Ambulatory Visit: Payer: Self-pay

## 2020-11-08 MED FILL — Gabapentin Cap 100 MG: ORAL | 30 days supply | Qty: 90 | Fill #0 | Status: AC

## 2020-11-08 MED FILL — Valacyclovir HCl Tab 1 GM: ORAL | 60 days supply | Qty: 60 | Fill #1 | Status: AC

## 2020-11-08 MED FILL — Pantoprazole Sodium EC Tab 40 MG (Base Equiv): ORAL | 30 days supply | Qty: 30 | Fill #0 | Status: AC

## 2020-11-21 ENCOUNTER — Inpatient Hospital Stay: Admission: RE | Admit: 2020-11-21 | Payer: No Typology Code available for payment source | Source: Ambulatory Visit

## 2020-12-03 ENCOUNTER — Telehealth (INDEPENDENT_AMBULATORY_CARE_PROVIDER_SITE_OTHER): Payer: Self-pay | Admitting: Primary Care

## 2020-12-03 DIAGNOSIS — B009 Herpesviral infection, unspecified: Secondary | ICD-10-CM

## 2020-12-03 NOTE — Telephone Encounter (Signed)
Please advise if appropriate.

## 2020-12-03 NOTE — Telephone Encounter (Signed)
Pt is calling and only has #30 of generic valtrex and would like an additional #90. Patient will be travelling and will be back in town Oct 2022. Pt was told per pharm to call provider. [facility]

## 2020-12-04 MED ORDER — VALACYCLOVIR HCL 1 G PO TABS
ORAL_TABLET | Freq: Every day | ORAL | 1 refills | Status: AC
  Filled 2020-12-04: qty 90, fill #0
  Filled 2020-12-05 – 2020-12-13 (×2): qty 90, 90d supply, fill #0
  Filled 2021-04-30: qty 90, 90d supply, fill #1

## 2020-12-04 NOTE — Telephone Encounter (Signed)
Done

## 2020-12-04 NOTE — Telephone Encounter (Signed)
Patient is stating pharmacy says she needs prescription from PCP in order to get refills. She will be out of country until [place] and would like 3 month supply.

## 2020-12-05 ENCOUNTER — Other Ambulatory Visit: Payer: Self-pay

## 2020-12-12 ENCOUNTER — Other Ambulatory Visit: Payer: Self-pay

## 2020-12-13 ENCOUNTER — Other Ambulatory Visit: Payer: Self-pay

## 2020-12-16 ENCOUNTER — Other Ambulatory Visit: Payer: Self-pay

## 2021-04-30 ENCOUNTER — Other Ambulatory Visit: Payer: Self-pay

## 2021-04-30 MED FILL — Gabapentin Cap 100 MG: ORAL | 30 days supply | Qty: 90 | Fill #1 | Status: AC

## 2021-05-05 ENCOUNTER — Other Ambulatory Visit: Payer: Self-pay

## 2021-08-19 ENCOUNTER — Other Ambulatory Visit: Payer: Self-pay

## 2021-08-19 ENCOUNTER — Other Ambulatory Visit (INDEPENDENT_AMBULATORY_CARE_PROVIDER_SITE_OTHER): Payer: Self-pay | Admitting: Family Medicine

## 2021-08-19 ENCOUNTER — Telehealth (INDEPENDENT_AMBULATORY_CARE_PROVIDER_SITE_OTHER): Payer: Self-pay | Admitting: Primary Care

## 2021-08-19 DIAGNOSIS — B009 Herpesviral infection, unspecified: Secondary | ICD-10-CM

## 2021-08-19 DIAGNOSIS — K219 Gastro-esophageal reflux disease without esophagitis: Secondary | ICD-10-CM

## 2021-08-19 DIAGNOSIS — G609 Hereditary and idiopathic neuropathy, unspecified: Secondary | ICD-10-CM

## 2021-08-22 ENCOUNTER — Other Ambulatory Visit: Payer: Self-pay

## 2021-08-26 NOTE — Telephone Encounter (Signed)
Pt wanted to know if these medications will be refilled, pt made an appt for 2/17, please advise.

## 2021-08-27 NOTE — Telephone Encounter (Signed)
Patient last OV was January 2022.  Needs to keep appt for additional refills

## 2021-08-29 ENCOUNTER — Ambulatory Visit (INDEPENDENT_AMBULATORY_CARE_PROVIDER_SITE_OTHER): Payer: No Typology Code available for payment source | Admitting: Primary Care

## 2022-09-14 ENCOUNTER — Telehealth: Payer: Self-pay | Admitting: Emergency Medicine

## 2022-09-14 NOTE — Telephone Encounter (Signed)
Copied from [facility] [id number]. 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

## 2022-09-15 ENCOUNTER — Telehealth: Payer: Self-pay

## 2022-09-15 NOTE — Telephone Encounter (Signed)
Patient called, she lives out of [place] and needs medical records regarding LTBI treatment emailed to her. Provided her with phone number for medical records.   Beryle Flock, RN

## 2022-09-28 NOTE — Telephone Encounter (Signed)
Received call from patient stating she needs letter from office stating she was treated for TB. Will reach out to Dr. Johnnye Sima who previously saw her to see if this is something he would be able to do.  Will call patient with update. Leatrice Jewels, RMA

## 2022-09-29 ENCOUNTER — Encounter: Payer: Self-pay | Admitting: Infectious Diseases

## 2022-09-29 ENCOUNTER — Telehealth: Payer: Self-pay | Admitting: *Deleted

## 2022-09-29 NOTE — Telephone Encounter (Signed)
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 She called [facility]  and received her records. But Immigration is requiring a letter from the doctor who treated her. [facility] told pt he's no longer and to call our office. Stated she needs a letter stating "To whom it may concern" ; the name of the medication and she was treated x 4 months. Pt's  requesting the letter to be sent via her My Chart. Thanks

## 2022-09-29 NOTE — Telephone Encounter (Addendum)
Pt was called and informed letter has been done; stated she was check My Chart (she lives in [place] now).

## 2022-09-29 NOTE — Telephone Encounter (Signed)
Error
